# Patient Record
Sex: Female | Born: 1935 | Race: White | Hispanic: No | Smoking: Never smoker
Health system: Southern US, Community
[De-identification: ages and names within clinical notes are randomized; demographics above are authoritative.]

## PROBLEM LIST (undated history)

## (undated) DIAGNOSIS — M810 Age-related osteoporosis without current pathological fracture: Secondary | ICD-10-CM

## (undated) DIAGNOSIS — I38 Endocarditis, valve unspecified: Secondary | ICD-10-CM

## (undated) DIAGNOSIS — E669 Obesity, unspecified: Secondary | ICD-10-CM

## (undated) DIAGNOSIS — G40909 Epilepsy, unspecified, not intractable, without status epilepticus: Secondary | ICD-10-CM

## (undated) DIAGNOSIS — C801 Malignant (primary) neoplasm, unspecified: Secondary | ICD-10-CM

## (undated) DIAGNOSIS — R233 Spontaneous ecchymoses: Secondary | ICD-10-CM

## (undated) DIAGNOSIS — Z8739 Personal history of other diseases of the musculoskeletal system and connective tissue: Secondary | ICD-10-CM

## (undated) DIAGNOSIS — R238 Other skin changes: Secondary | ICD-10-CM

## (undated) DIAGNOSIS — M1990 Unspecified osteoarthritis, unspecified site: Secondary | ICD-10-CM

## (undated) DIAGNOSIS — E785 Hyperlipidemia, unspecified: Secondary | ICD-10-CM

## (undated) DIAGNOSIS — N189 Chronic kidney disease, unspecified: Secondary | ICD-10-CM

## (undated) DIAGNOSIS — I10 Essential (primary) hypertension: Secondary | ICD-10-CM

## (undated) DIAGNOSIS — J309 Allergic rhinitis, unspecified: Secondary | ICD-10-CM

## (undated) DIAGNOSIS — K219 Gastro-esophageal reflux disease without esophagitis: Secondary | ICD-10-CM

## (undated) DIAGNOSIS — K5790 Diverticulosis of intestine, part unspecified, without perforation or abscess without bleeding: Secondary | ICD-10-CM

## (undated) DIAGNOSIS — I8310 Varicose veins of unspecified lower extremity with inflammation: Secondary | ICD-10-CM

## (undated) DIAGNOSIS — R569 Unspecified convulsions: Secondary | ICD-10-CM

## (undated) HISTORY — PX: APPENDECTOMY: SHX54

## (undated) HISTORY — DX: Unspecified osteoarthritis, unspecified site: M19.90

## (undated) HISTORY — PX: ABDOMINAL HYSTERECTOMY: SHX81

## (undated) HISTORY — PX: JOINT REPLACEMENT: SHX530

## (undated) HISTORY — PX: COLONOSCOPY: SHX174

## (undated) HISTORY — DX: Malignant (primary) neoplasm, unspecified: C80.1

## (undated) HISTORY — PX: CHOLECYSTECTOMY: SHX55

## (undated) HISTORY — PX: TONSILLECTOMY: SUR1361

## (undated) HISTORY — PX: CATARACT EXTRACTION BILATERAL W/ ANTERIOR VITRECTOMY: SHX1304

## (undated) HISTORY — PX: CARPAL TUNNEL RELEASE: SHX101

## (undated) HISTORY — PX: SHOULDER SURGERY: SHX246

## (undated) HISTORY — PX: BREAST EXCISIONAL BIOPSY: SUR124

## (undated) HISTORY — DX: Unspecified convulsions: R56.9

## (undated) HISTORY — PX: BREAST SURGERY: SHX581

---

## 1999-03-09 HISTORY — PX: REPLACEMENT TOTAL KNEE: SUR1224

## 2003-03-09 HISTORY — PX: FOOT SURGERY: SHX648

## 2003-05-17 ENCOUNTER — Other Ambulatory Visit: Payer: Self-pay

## 2005-03-08 HISTORY — PX: TOTAL SHOULDER REPLACEMENT: SUR1217

## 2005-09-21 ENCOUNTER — Ambulatory Visit: Payer: Self-pay | Admitting: Specialist

## 2005-09-21 ENCOUNTER — Other Ambulatory Visit: Payer: Self-pay

## 2005-11-16 ENCOUNTER — Ambulatory Visit: Payer: Self-pay | Admitting: Orthopedic Surgery

## 2006-01-12 ENCOUNTER — Other Ambulatory Visit: Payer: Self-pay

## 2006-01-12 ENCOUNTER — Ambulatory Visit: Payer: Self-pay | Admitting: Orthopedic Surgery

## 2006-01-21 ENCOUNTER — Other Ambulatory Visit: Payer: Self-pay

## 2006-01-21 ENCOUNTER — Inpatient Hospital Stay: Payer: Self-pay | Admitting: Orthopedic Surgery

## 2006-01-22 ENCOUNTER — Other Ambulatory Visit: Payer: Self-pay

## 2006-04-15 ENCOUNTER — Ambulatory Visit: Payer: Self-pay | Admitting: Specialist

## 2006-05-27 ENCOUNTER — Encounter: Admission: RE | Admit: 2006-05-27 | Discharge: 2006-05-27 | Payer: Self-pay | Admitting: *Deleted

## 2006-10-06 ENCOUNTER — Ambulatory Visit: Payer: Self-pay | Admitting: Specialist

## 2006-10-11 ENCOUNTER — Ambulatory Visit: Payer: Self-pay | Admitting: Unknown Physician Specialty

## 2007-01-24 ENCOUNTER — Ambulatory Visit: Payer: Self-pay | Admitting: Specialist

## 2007-01-24 ENCOUNTER — Other Ambulatory Visit: Payer: Self-pay

## 2007-03-07 ENCOUNTER — Ambulatory Visit: Payer: Self-pay | Admitting: Cardiology

## 2007-06-06 ENCOUNTER — Ambulatory Visit: Payer: Self-pay | Admitting: Unknown Physician Specialty

## 2007-08-04 ENCOUNTER — Ambulatory Visit: Payer: Self-pay | Admitting: Vascular Surgery

## 2007-10-12 ENCOUNTER — Ambulatory Visit: Payer: Self-pay | Admitting: Unknown Physician Specialty

## 2007-11-02 ENCOUNTER — Ambulatory Visit: Payer: Self-pay | Admitting: Surgery

## 2008-05-21 ENCOUNTER — Ambulatory Visit: Payer: Self-pay | Admitting: Specialist

## 2008-10-25 ENCOUNTER — Ambulatory Visit: Payer: Self-pay | Admitting: Specialist

## 2008-10-30 ENCOUNTER — Ambulatory Visit: Payer: Self-pay | Admitting: Unknown Physician Specialty

## 2009-02-11 ENCOUNTER — Ambulatory Visit: Payer: Self-pay | Admitting: Pain Medicine

## 2009-03-05 ENCOUNTER — Ambulatory Visit: Payer: Self-pay | Admitting: Physician Assistant

## 2009-03-08 HISTORY — PX: BACK SURGERY: SHX140

## 2009-04-08 ENCOUNTER — Ambulatory Visit: Payer: Self-pay | Admitting: Pain Medicine

## 2009-06-04 ENCOUNTER — Inpatient Hospital Stay (HOSPITAL_COMMUNITY): Admission: RE | Admit: 2009-06-04 | Discharge: 2009-06-09 | Payer: Self-pay | Admitting: Neurosurgery

## 2009-12-02 ENCOUNTER — Ambulatory Visit: Payer: Self-pay | Admitting: Unknown Physician Specialty

## 2010-06-01 LAB — DIFFERENTIAL
Basophils Relative: 0 % (ref 0–1)
Eosinophils Absolute: 0.1 10*3/uL (ref 0.0–0.7)
Eosinophils Relative: 1 % (ref 0–5)
Lymphocytes Relative: 5 % — ABNORMAL LOW (ref 12–46)
Monocytes Absolute: 1 10*3/uL (ref 0.1–1.0)
Monocytes Relative: 10 % (ref 3–12)

## 2010-06-01 LAB — ABO/RH: ABO/RH(D): A POS

## 2010-06-01 LAB — CBC
Hemoglobin: 11.1 g/dL — ABNORMAL LOW (ref 12.0–15.0)
MCHC: 34.6 g/dL (ref 30.0–36.0)
MCHC: 34.7 g/dL (ref 30.0–36.0)
MCHC: 34.8 g/dL (ref 30.0–36.0)
MCV: 90.5 fL (ref 78.0–100.0)
MCV: 91 fL (ref 78.0–100.0)
Platelets: 136 10*3/uL — ABNORMAL LOW (ref 150–400)
Platelets: 195 10*3/uL (ref 150–400)
RBC: 3.52 MIL/uL — ABNORMAL LOW (ref 3.87–5.11)
WBC: 5.4 10*3/uL (ref 4.0–10.5)

## 2010-06-01 LAB — BASIC METABOLIC PANEL
BUN: 12 mg/dL (ref 6–23)
BUN: 21 mg/dL (ref 6–23)
CO2: 31 mEq/L (ref 19–32)
Chloride: 100 mEq/L (ref 96–112)
GFR calc Af Amer: >60 mL/min (ref >60)
GFR calc non Af Amer: >60 mL/min (ref >60)
Glucose, Bld: 106 mg/dL — ABNORMAL HIGH (ref 70–99)
Potassium: 3.3 mEq/L — ABNORMAL LOW (ref 3.5–5.1)
Sodium: 137 mEq/L (ref 135–145)
Sodium: 139 mEq/L (ref 135–145)

## 2010-06-01 LAB — SURGICAL PCR SCREEN: MRSA, PCR: NEGATIVE

## 2010-06-01 LAB — TYPE AND SCREEN: ABO/RH(D): A POS

## 2010-06-01 LAB — URINE CULTURE

## 2010-06-01 LAB — CULTURE, BLOOD (ROUTINE X 2): Culture: NO GROWTH

## 2010-06-03 ENCOUNTER — Ambulatory Visit: Payer: Self-pay | Admitting: Specialist

## 2010-08-07 ENCOUNTER — Ambulatory Visit: Payer: Self-pay | Admitting: Specialist

## 2010-12-29 ENCOUNTER — Ambulatory Visit: Payer: Self-pay | Admitting: Specialist

## 2011-06-11 DIAGNOSIS — G40909 Epilepsy, unspecified, not intractable, without status epilepticus: Secondary | ICD-10-CM | POA: Insufficient documentation

## 2011-06-11 DIAGNOSIS — I10 Essential (primary) hypertension: Secondary | ICD-10-CM | POA: Insufficient documentation

## 2011-06-11 DIAGNOSIS — M810 Age-related osteoporosis without current pathological fracture: Secondary | ICD-10-CM | POA: Insufficient documentation

## 2011-06-11 DIAGNOSIS — E669 Obesity, unspecified: Secondary | ICD-10-CM | POA: Insufficient documentation

## 2011-06-11 DIAGNOSIS — E7800 Pure hypercholesterolemia, unspecified: Secondary | ICD-10-CM | POA: Insufficient documentation

## 2011-06-11 DIAGNOSIS — I8390 Asymptomatic varicose veins of unspecified lower extremity: Secondary | ICD-10-CM | POA: Insufficient documentation

## 2011-12-13 ENCOUNTER — Ambulatory Visit: Payer: Self-pay | Admitting: Specialist

## 2011-12-29 ENCOUNTER — Ambulatory Visit: Payer: Self-pay | Admitting: Specialist

## 2012-01-18 ENCOUNTER — Ambulatory Visit: Payer: Self-pay | Admitting: Specialist

## 2012-03-17 ENCOUNTER — Ambulatory Visit: Payer: Self-pay | Admitting: Unknown Physician Specialty

## 2012-03-21 LAB — PATHOLOGY REPORT

## 2012-07-18 ENCOUNTER — Ambulatory Visit: Payer: Self-pay | Admitting: Specialist

## 2013-01-09 ENCOUNTER — Other Ambulatory Visit: Payer: Self-pay | Admitting: Neurology

## 2013-01-09 MED ORDER — GABAPENTIN 300 MG PO CAPS: 300.0000 mg | ORAL_CAPSULE | Freq: Two times a day (BID) | ORAL | Status: AC

## 2013-01-09 MED ORDER — CARBAMAZEPINE 200 MG PO TABS: 200.0000 mg | ORAL_TABLET | Freq: Two times a day (BID) | ORAL | Status: AC

## 2013-01-09 NOTE — Telephone Encounter (Signed)
Pt's prescriptions were faxed over to [facility] at [phone].

## 2013-01-18 ENCOUNTER — Ambulatory Visit: Payer: Self-pay | Admitting: Family Medicine

## 2014-01-22 ENCOUNTER — Ambulatory Visit: Payer: Self-pay | Admitting: Family Medicine

## 2014-05-10 ENCOUNTER — Ambulatory Visit: Payer: Self-pay | Admitting: Neurosurgery

## 2014-07-30 ENCOUNTER — Other Ambulatory Visit: Payer: Self-pay | Admitting: Specialist

## 2014-07-30 DIAGNOSIS — M25552 Pain in left hip: Secondary | ICD-10-CM

## 2014-08-12 ENCOUNTER — Ambulatory Visit
Admission: RE | Admit: 2014-08-12 | Discharge: 2014-08-12 | Disposition: A | Discharge status: Home / Self Care | Payer: Medicare Other | Source: Ambulatory Visit | Attending: Specialist | Admitting: Specialist

## 2014-08-12 DIAGNOSIS — M25552 Pain in left hip: Secondary | ICD-10-CM | POA: Insufficient documentation

## 2014-08-12 HISTORY — DX: Chronic kidney disease, unspecified: N18.9

## 2014-08-12 MED ORDER — METHYLPREDNISOLONE ACETATE 80 MG/ML IJ SUSP
80.0000 mg | Freq: Once | INTRAMUSCULAR | Status: AC
  Administered 2014-08-12: 80 mg via INTRA_ARTICULAR

## 2014-08-12 MED ORDER — METHYLPREDNISOLONE ACETATE 80 MG/ML IJ SUSP: 1.0000 mg | Freq: Once | INTRAMUSCULAR | Status: DC

## 2014-08-12 MED ORDER — BUPIVACAINE HCL (PF) 0.25 % IJ SOLN
7.0000 mL | Freq: Once | INTRAMUSCULAR | Status: AC
  Administered 2014-08-12: 7 mL via INTRA_ARTICULAR

## 2015-01-01 ENCOUNTER — Other Ambulatory Visit: Payer: Self-pay | Admitting: Pain Medicine

## 2015-01-01 ENCOUNTER — Ambulatory Visit: Payer: Medicare Other | Attending: Pain Medicine | Admitting: Pain Medicine

## 2015-01-01 ENCOUNTER — Encounter: Payer: Self-pay | Admitting: Pain Medicine

## 2015-01-01 VITALS — BP 181/52 | HR 70 | Temp 98.4°F | Resp 18 | Ht 63.5 in | Wt 205.0 lb

## 2015-01-01 DIAGNOSIS — Z79899 Other long term (current) drug therapy: Secondary | ICD-10-CM | POA: Diagnosis not present

## 2015-01-01 DIAGNOSIS — M79605 Pain in left leg: Secondary | ICD-10-CM | POA: Diagnosis not present

## 2015-01-01 DIAGNOSIS — F1190 Opioid use, unspecified, uncomplicated: Secondary | ICD-10-CM | POA: Diagnosis not present

## 2015-01-01 DIAGNOSIS — I10 Essential (primary) hypertension: Secondary | ICD-10-CM | POA: Diagnosis not present

## 2015-01-01 DIAGNOSIS — M25552 Pain in left hip: Secondary | ICD-10-CM | POA: Diagnosis not present

## 2015-01-01 DIAGNOSIS — G8929 Other chronic pain: Secondary | ICD-10-CM | POA: Diagnosis not present

## 2015-01-01 DIAGNOSIS — M5416 Radiculopathy, lumbar region: Secondary | ICD-10-CM

## 2015-01-01 DIAGNOSIS — M545 Low back pain: Secondary | ICD-10-CM | POA: Insufficient documentation

## 2015-01-01 DIAGNOSIS — M539 Dorsopathy, unspecified: Secondary | ICD-10-CM

## 2015-01-01 DIAGNOSIS — Z9889 Other specified postprocedural states: Secondary | ICD-10-CM | POA: Insufficient documentation

## 2015-01-01 DIAGNOSIS — Z981 Arthrodesis status: Secondary | ICD-10-CM | POA: Insufficient documentation

## 2015-01-01 DIAGNOSIS — M961 Postlaminectomy syndrome, not elsewhere classified: Secondary | ICD-10-CM

## 2015-01-01 DIAGNOSIS — F1120 Opioid dependence, uncomplicated: Secondary | ICD-10-CM

## 2015-01-01 DIAGNOSIS — M25559 Pain in unspecified hip: Secondary | ICD-10-CM | POA: Diagnosis present

## 2015-01-01 DIAGNOSIS — Z5181 Encounter for therapeutic drug level monitoring: Secondary | ICD-10-CM

## 2015-01-01 DIAGNOSIS — Z79891 Long term (current) use of opiate analgesic: Secondary | ICD-10-CM

## 2015-01-01 MED ORDER — FENTANYL 12 MCG/HR TD PT72: 12.5000 ug | MEDICATED_PATCH | TRANSDERMAL | Status: DC

## 2015-01-01 NOTE — Patient Instructions (Addendum)
Nothing to eat or drink 6 hours before your appointment. You will not be able to drive for 24 hours after the procedure so you will need to bring a driver with you to your appointment.   A prescription for DURAGESIC was given to you today.

## 2015-01-01 NOTE — Progress Notes (Signed)
Safety precautions to be maintained throughout the outpatient stay will include: orient to surroundings, keep bed in low position, maintain call bell within reach at all times, provide assistance with transfer out of bed and ambulation.  

## 2015-01-03 ENCOUNTER — Other Ambulatory Visit
Admission: RE | Admit: 2015-01-03 | Discharge: 2015-01-03 | Disposition: A | Discharge status: Home / Self Care | Payer: Medicare Other | Source: Ambulatory Visit | Attending: Pain Medicine | Admitting: Pain Medicine

## 2015-01-03 DIAGNOSIS — G8929 Other chronic pain: Secondary | ICD-10-CM | POA: Insufficient documentation

## 2015-01-03 LAB — COMPREHENSIVE METABOLIC PANEL
ALBUMIN: 3.8 g/dL (ref 3.5–5.0)
ALT: 11 U/L — ABNORMAL LOW (ref 14–54)
AST: 19 U/L (ref 15–41)
Alkaline Phosphatase: 57 U/L (ref 38–126)
Anion gap: 9 (ref 5–15)
BILIRUBIN TOTAL: 0.6 mg/dL (ref 0.3–1.2)
BUN: 19 mg/dL (ref 6–20)
CO2: 26 mmol/L (ref 22–32)
Calcium: 9.1 mg/dL (ref 8.9–10.3)
Chloride: 108 mmol/L (ref 101–111)
Creatinine, Ser: 0.8 mg/dL (ref 0.44–1.00)
GFR calc Af Amer: >60 mL/min (ref >60)
GFR calc non Af Amer: >60 mL/min (ref >60)
GLUCOSE: 96 mg/dL (ref 65–99)
POTASSIUM: 3.8 mmol/L (ref 3.5–5.1)
Sodium: 143 mmol/L (ref 135–145)
Total Protein: 6.5 g/dL (ref 6.5–8.1)

## 2015-01-03 LAB — SEDIMENTATION RATE: Sed Rate: 15 mm/hr (ref 0–30)

## 2015-01-03 LAB — C-REACTIVE PROTEIN: CRP: <0.5 mg/dL (ref <1.0)

## 2015-01-03 LAB — MAGNESIUM: Magnesium: 2 mg/dL (ref 1.7–2.4)

## 2015-01-08 LAB — 25-HYDROXY VITAMIN D LCMS D2+D3
25-Hydroxy, Vitamin D-2: <1 ng/mL
25-Hydroxy, Vitamin D-3: 21 ng/mL
25-Hydroxy, Vitamin D: 21 ng/mL — ABNORMAL LOW

## 2015-01-09 ENCOUNTER — Ambulatory Visit: Payer: Medicare Other | Attending: Pain Medicine | Admitting: Pain Medicine

## 2015-01-09 ENCOUNTER — Encounter: Payer: Self-pay | Admitting: Pain Medicine

## 2015-01-09 VITALS — BP 161/78 | HR 68 | Temp 97.8°F | Resp 16 | Ht 63.0 in | Wt 206.0 lb

## 2015-01-09 DIAGNOSIS — M791 Myalgia: Secondary | ICD-10-CM | POA: Diagnosis not present

## 2015-01-09 DIAGNOSIS — E7800 Pure hypercholesterolemia, unspecified: Secondary | ICD-10-CM | POA: Diagnosis not present

## 2015-01-09 DIAGNOSIS — M1612 Unilateral primary osteoarthritis, left hip: Secondary | ICD-10-CM | POA: Insufficient documentation

## 2015-01-09 DIAGNOSIS — M8580 Other specified disorders of bone density and structure, unspecified site: Secondary | ICD-10-CM | POA: Insufficient documentation

## 2015-01-09 DIAGNOSIS — G8929 Other chronic pain: Secondary | ICD-10-CM

## 2015-01-09 DIAGNOSIS — M25552 Pain in left hip: Secondary | ICD-10-CM | POA: Diagnosis present

## 2015-01-09 DIAGNOSIS — G40909 Epilepsy, unspecified, not intractable, without status epilepticus: Secondary | ICD-10-CM | POA: Diagnosis not present

## 2015-01-09 DIAGNOSIS — I10 Essential (primary) hypertension: Secondary | ICD-10-CM | POA: Insufficient documentation

## 2015-01-09 DIAGNOSIS — M7918 Myalgia, other site: Secondary | ICD-10-CM | POA: Insufficient documentation

## 2015-01-09 MED ORDER — TRIAMCINOLONE ACETONIDE 40 MG/ML IJ SUSP
INTRAMUSCULAR | Status: AC
  Administered 2015-01-09: 10:00:00
  Filled 2015-01-09: qty 1

## 2015-01-09 MED ORDER — FENTANYL CITRATE (PF) 100 MCG/2ML IJ SOLN
INTRAMUSCULAR | Status: AC
  Administered 2015-01-09: 50 ug
  Filled 2015-01-09: qty 2

## 2015-01-09 MED ORDER — FENTANYL CITRATE (PF) 100 MCG/2ML IJ SOLN: 100.0000 ug | Freq: Once | INTRAMUSCULAR | Status: DC

## 2015-01-09 MED ORDER — IOHEXOL 180 MG/ML  SOLN
INTRAMUSCULAR | Status: AC
Start: 2015-01-09 — End: 2015-01-09
  Administered 2015-01-09: 10:00:00
  Filled 2015-01-09: qty 20

## 2015-01-09 MED ORDER — ROPIVACAINE HCL 2 MG/ML IJ SOLN: 9.0000 mL | Freq: Once | INTRAMUSCULAR | Status: DC

## 2015-01-09 MED ORDER — ROPIVACAINE HCL 2 MG/ML IJ SOLN
INTRAMUSCULAR | Status: AC
  Administered 2015-01-09: 10:00:00
  Filled 2015-01-09: qty 10

## 2015-01-09 MED ORDER — LIDOCAINE HCL (PF) 1 % IJ SOLN: 10.0000 mL | Freq: Once | INTRAMUSCULAR | Status: DC

## 2015-01-09 MED ORDER — MIDAZOLAM HCL 5 MG/5ML IJ SOLN
INTRAMUSCULAR | Status: AC
  Administered 2015-01-09: 2 mg via INTRAVENOUS
  Filled 2015-01-09: qty 5

## 2015-01-09 MED ORDER — LACTATED RINGERS IV SOLN: 1000.0000 mL | INTRAVENOUS | Status: AC

## 2015-01-09 MED ORDER — METHYLPREDNISOLONE ACETATE 80 MG/ML IJ SUSP
INTRAMUSCULAR | Status: AC
  Administered 2015-01-09: 10:00:00
  Filled 2015-01-09: qty 1

## 2015-01-09 MED ORDER — METHYLPREDNISOLONE ACETATE 80 MG/ML IJ SUSP: 80.0000 mg | Freq: Once | INTRAMUSCULAR | Status: DC

## 2015-01-09 MED ORDER — ROPIVACAINE HCL 2 MG/ML IJ SOLN
INTRAMUSCULAR | Status: AC
Start: 2015-01-09 — End: 2015-01-09
  Administered 2015-01-09: 10:00:00
  Filled 2015-01-09: qty 10

## 2015-01-09 MED ORDER — METHYLPREDNISOLONE ACETATE 40 MG/ML IJ SUSP: 40.0000 mg | Freq: Once | INTRAMUSCULAR | Status: DC

## 2015-01-09 MED ORDER — MIDAZOLAM HCL 5 MG/5ML IJ SOLN: 5.0000 mg | Freq: Once | INTRAMUSCULAR | Status: DC

## 2015-01-09 NOTE — Progress Notes (Signed)
Safety precautions to be maintained throughout the outpatient stay will include: orient to surroundings, keep bed in low position, maintain call bell within reach at all times, provide assistance with transfer out of bed and ambulation.  

## 2015-01-09 NOTE — Patient Instructions (Addendum)
GENERAL RISKS AND COMPLICATIONS  What are the risk, side effects and possible complications? Generally speaking, most procedures are safe.  However, with any procedure there are risks, side effects, and the possibility of complications.  The risks and complications are dependent upon the sites that are lesioned, or the type of nerve block to be performed.  The closer the procedure is to the spine, the more serious the risks are.  Great care is taken when placing the radio frequency needles, block needles or lesioning probes, but sometimes complications can occur.  Infection: Any time there is an injection through the skin, there is a risk of infection.  This is why sterile conditions are used for these blocks.  There are four possible types of infection.  Localized skin infection.  Central Nervous System Infection-This can be in the form of Meningitis, which can be deadly.  Epidural Infections-This can be in the form of an epidural abscess, which can cause pressure inside of the spine, causing compression of the spinal cord with subsequent paralysis. This would require an emergency surgery to decompress, and there are no guarantees that the patient would recover from the paralysis.  Discitis-This is an infection of the intervertebral discs.  It occurs in about 1% of discography procedures.  It is difficult to treat and it may lead to surgery.        2. Pain: the needles have to go through skin and soft tissues, will cause soreness.       3. Damage to internal structures:  The nerves to be lesioned may be near blood vessels or    other nerves which can be potentially damaged.       4. Bleeding: Bleeding is more common if the patient is taking blood thinners such as  aspirin, Coumadin, Ticiid, Plavix, etc., or if he/she have some genetic predisposition  such as hemophilia. Bleeding into the spinal canal can cause compression of the spinal  cord with subsequent paralysis.  This would require an  emergency surgery to  decompress and there are no guarantees that the patient would recover from the  paralysis.       5. Pneumothorax:  Puncturing of a lung is a possibility, every time a needle is introduced in  the area of the chest or upper back.  Pneumothorax refers to free air around the  collapsed lung(s), inside of the thoracic cavity (chest cavity).  Another two possible  complications related to a similar event would include: Hemothorax and Chylothorax.   These are variations of the Pneumothorax, where instead of air around the collapsed  lung(s), you may have blood or chyle, respectively.       6. Spinal headaches: They may occur with any procedures in the area of the spine.       7. Persistent CSF (Cerebro-Spinal Fluid) leakage: This is a rare problem, but may occur  with prolonged intrathecal or epidural catheters either due to the formation of a fistulous  track or a dural tear.       8. Nerve damage: By working so close to the spinal cord, there is always a possibility of  nerve damage, which could be as serious as a permanent spinal cord injury with  paralysis.       9. Death:  Although rare, severe deadly allergic reactions known as "Anaphylactic  reaction" can occur to any of the medications used.      10. Worsening of the symptoms:  We can always make thing worse.  What are the chances of something like this happening? Chances of any of this occuring are extremely low.  By statistics, you have more of a chance of getting killed in a motor vehicle accident: while driving to the hospital than any of the above occurring .  Nevertheless, you should be aware that they are possibilities.  In general, it is similar to taking a shower.  Everybody knows that you can slip, hit your head and get killed.  Does that mean that you should not shower again?  Nevertheless always keep in mind that statistics do not mean anything if you happen to be on the wrong side of them.  Even if a procedure has a 1  (one) in a 1,000,000 (million) chance of going wrong, it you happen to be that one..Also, keep in mind that by statistics, you have more of a chance of having something go wrong when taking medications.  Who should not have this procedure? If you are on a blood thinning medication (e.g. Coumadin, Plavix, see list of "Blood Thinners"), or if you have an active infection going on, you should not have the procedure.  If you are taking any blood thinners, please inform your physician.  How should I prepare for this procedure?  Do not eat or drink anything at least six hours prior to the procedure.  Bring a driver with you .  It cannot be a taxi.  Come accompanied by an adult that can drive you back, and that is strong enough to help you if your legs get weak or numb from the local anesthetic.  Take all of your medicines the morning of the procedure with just enough water to swallow them.  If you have diabetes, make sure that you are scheduled to have your procedure done first thing in the morning, whenever possible.  If you have diabetes, take only half of your insulin dose and notify our nurse that you have done so as soon as you arrive at the clinic.  If you are diabetic, but only take blood sugar pills (oral hypoglycemic), then do not take them on the morning of your procedure.  You may take them after you have had the procedure.  Do not take aspirin or any aspirin-containing medications, at least eleven (11) days prior to the procedure.  They may prolong bleeding.  Wear loose fitting clothing that may be easy to take off and that you would not mind if it got stained with Betadine or blood.  Do not wear any jewelry or perfume  Remove any nail coloring.  It will interfere with some of our monitoring equipment.  NOTE: Remember that this is not meant to be interpreted as a complete list of all possible complications.  Unforeseen problems may occur.  BLOOD THINNERS The following drugs  contain aspirin or other products, which can cause increased bleeding during surgery and should not be taken for 2 weeks prior to and 1 week after surgery.  If you should need take something for relief of minor pain, you may take acetaminophen which is found in Tylenol,m Datril, Anacin-3 and Panadol. It is not blood thinner. The products listed below are.  Do not take any of the products listed below in addition to any listed on your instruction sheet.  A.P.C or A.P.C with Codeine Codeine Phosphate Capsules #3 Ibuprofen Ridaura  ABC compound Congesprin Imuran rimadil  Advil Cope Indocin Robaxisal  Alka-Seltzer Effervescent Pain Reliever and Antacid Coricidin or Coricidin-D  Indomethacin Rufen    Alka-Seltzer plus Cold Medicine Cosprin Ketoprofen S-A-C Tablets  Anacin Analgesic Tablets or Capsules Coumadin Korlgesic Salflex  Anacin Extra Strength Analgesic tablets or capsules CP-2 Tablets Lanoril Salicylate  Anaprox Cuprimine Capsules Levenox Salocol  Anexsia-D Dalteparin Magan Salsalate  Anodynos Darvon compound Magnesium Salicylate Sine-off  Ansaid Dasin Capsules Magsal Sodium Salicylate  Anturane Depen Capsules Marnal Soma  APF Arthritis pain formula Dewitt's Pills Measurin Stanback  Argesic Dia-Gesic Meclofenamic Sulfinpyrazone  Arthritis Bayer Timed Release Aspirin Diclofenac Meclomen Sulindac  Arthritis pain formula Anacin Dicumarol Medipren Supac  Analgesic (Safety coated) Arthralgen Diffunasal Mefanamic Suprofen  Arthritis Strength Bufferin Dihydrocodeine Mepro Compound Suprol  Arthropan liquid Dopirydamole Methcarbomol with Aspirin Synalgos  ASA tablets/Enseals Disalcid Micrainin Tagament  Ascriptin Doan's Midol Talwin  Ascriptin A/D Dolene Mobidin Tanderil  Ascriptin Extra Strength Dolobid Moblgesic Ticlid  Ascriptin with Codeine Doloprin or Doloprin with Codeine Momentum Tolectin  Asperbuf Duoprin Mono-gesic Trendar  Aspergum Duradyne Motrin or Motrin IB Triminicin  Aspirin  plain, buffered or enteric coated Durasal Myochrisine Trigesic  Aspirin Suppositories Easprin Nalfon Trillsate  Aspirin with Codeine Ecotrin Regular or Extra Strength Naprosyn Uracel  Atromid-S Efficin Naproxen Ursinus  Auranofin Capsules Elmiron Neocylate Vanquish  Axotal Emagrin Norgesic Verin  Azathioprine Empirin or Empirin with Codeine Normiflo Vitamin E  Azolid Emprazil Nuprin Voltaren  Bayer Aspirin plain, buffered or children's or timed BC Tablets or powders Encaprin Orgaran Warfarin Sodium  Buff-a-Comp Enoxaparin Orudis Zorpin  Buff-a-Comp with Codeine Equegesic Os-Cal-Gesic   Buffaprin Excedrin plain, buffered or Extra Strength Oxalid   Bufferin Arthritis Strength Feldene Oxphenbutazone   Bufferin plain or Extra Strength Feldene Capsules Oxycodone with Aspirin   Bufferin with Codeine Fenoprofen Fenoprofen Pabalate or Pabalate-SF   Buffets II Flogesic Panagesic   Buffinol plain or Extra Strength Florinal or Florinal with Codeine Panwarfarin   Buf-Tabs Flurbiprofen Penicillamine   Butalbital Compound Four-way cold tablets Penicillin   Butazolidin Fragmin Pepto-Bismol   Carbenicillin Geminisyn Percodan   Carna Arthritis Reliever Geopen Persantine   Carprofen Gold's salt Persistin   Chloramphenicol Goody's Phenylbutazone   Chloromycetin Haltrain Piroxlcam   Clmetidine heparin Plaquenil   Cllnoril Hyco-pap Ponstel   Clofibrate Hydroxy chloroquine Propoxyphen         Before stopping any of these medications, be sure to consult the physician who ordered them.  Some, such as Coumadin (Warfarin) are ordered to prevent or treat serious conditions such as "deep thrombosis", "pumonary embolisms", and other heart problems.  The amount of time that you may need off of the medication may also vary with the medication and the reason for which you were taking it.  If you are taking any of these medications, please make sure you notify your pain physician before you undergo any  procedures.         Pain Management Discharge Instructions  General Discharge Instructions :  If you need to reach your doctor call: Monday-Friday 8:00 am - 4:00 pm at [phone] or toll free [phone].  After clinic hours [phone] to have operator reach doctor.  Bring all of your medication bottles to all your appointments in the pain clinic.  To cancel or reschedule your appointment with Pain Management please remember to call 24 hours in advance to avoid a fee.  Refer to the educational materials which you have been given on: General Risks, I had my Procedure. Discharge Instructions, Post Sedation.  Post Procedure Instructions:  The drugs you were given will stay in your system until tomorrow, so for the next 24 hours you should   not drive, make any legal decisions or drink any alcoholic beverages.  You may eat anything you prefer, but it is better to start with liquids then soups and crackers, and gradually work up to solid foods.  Please notify your doctor immediately if you have any unusual bleeding, trouble breathing or pain that is not related to your normal pain.  Depending on the type of procedure that was done, some parts of your body may feel week and/or numb.  This usually clears up by tonight or the next day.  Walk with the use of an assistive device or accompanied by an adult for the 24 hours.  You may use ice on the affected area for the first 24 hours.  Put ice in a Ziploc bag and cover with a towel and place against area 15 minutes on 15 minutes off.  You may switch to heat after 24 hours.Trigger Point Injection Trigger points are areas where you have muscle pain. A trigger point injection is a shot given in the trigger point to relieve that pain. A trigger point might feel like a knot in your muscle. It hurts to press on a trigger point. Sometimes the pain spreads out (radiates) to other parts of the body. For example, pressing on a trigger point in your  shoulder might cause pain in your arm or neck. You might have one trigger point. Or, you might have more than one. People often have trigger points in their upper back and lower back. They also occur often in the neck and shoulders. Pain from a trigger point lasts for a long time. It can make it hard to keep moving. You might not be able to do the exercise or physical therapy that could help you deal with the pain. A trigger point injection may help. It does not work for everyone. But, it may relieve your pain for a few days or a few months. A trigger point injection does not cure long-lasting (chronic) pain. LET YOUR CAREGIVER KNOW ABOUT:  Any allergies (especially to latex, lidocaine, or steroids).  Blood-thinning medicines that you take. These drugs can lead to bleeding or bruising after an injection. They include:  Aspirin.  Ibuprofen.  Clopidogrel.  Warfarin.  Other medicines you take. This includes all vitamins, herbs, eyedrops, over-the-counter medicines, and creams.  Use of steroids.  Recent infections.  Past problems with numbing medicines.  Bleeding problems.  Surgeries you have had.  Other health problems. RISKS AND COMPLICATIONS A trigger point injection is a safe treatment. However, problems may develop, such as:  Minor side effects usually go away in 1 to 2 days. These may include:  Soreness.  Bruising.  Stiffness.  More serious problems are rare. But, they may include:  Bleeding under the skin (hematoma).  Skin infection.  Breaking off of the needle under your skin.  Lung puncture.  The trigger point injection may not work for you. BEFORE THE PROCEDURE You may need to stop taking any medicine that thins your blood. This is to prevent bleeding and bruising. Usually these medicines are stopped several days before the injection. No other preparation is needed. PROCEDURE  A trigger point injection can be given in your caregiver's office or in a clinic.  Each injection takes 2 minutes or less.  Your caregiver will feel for trigger points. The caregiver may use a marker to circle the area for the injection.  The skin over the trigger point will be washed with a germ-killing (antiseptic) solution.  The  caregiver pinches the spot for the injection.  Then, a very thin needle is used for the shot. You may feel pain or a twitching feeling when the needle enters the trigger point.  A numbing solution may be injected into the trigger point. Sometimes a drug to keep down swelling, redness, and warmth (inflammation) is also injected.  Your caregiver moves the needle around the trigger zone until the tightness and twitching goes away.  After the injection, your caregiver may put gentle pressure over the injection site.  Then it is covered with a bandage. AFTER THE PROCEDURE  You can go right home after the injection.  The bandage can be taken off after a few hours.  You may feel sore and stiff for 1 to 2 days.  Go back to your regular activities slowly. Your caregiver may ask you to stretch your muscles. Do not do anything that takes extra energy for a few days.  Follow your caregiver's instructions to manage and treat other pain.   This information is not intended to replace advice given to you by your health care provider. Make sure you discuss any questions you have with your health care provider.   Document Released: 02/11/2011 Document Revised: 06/19/2012 Document Reviewed: 02/11/2011 Elsevier Interactive Patient Education [employer].

## 2015-01-10 ENCOUNTER — Telehealth: Payer: Self-pay

## 2015-01-10 LAB — TOXASSURE SELECT 13 (MW), URINE: PDF: 0

## 2015-01-10 NOTE — Telephone Encounter (Signed)
Left message

## 2015-01-10 NOTE — Progress Notes (Signed)
Patient's Name: Alisha Mullen MRN: [id number] DOB: 1934/12/09 DOS: 01/09/2015  Primary Reason(s) for Visit: Interventional Pain Management Treatment. CC: Hip Pain  Pre-Procedure Assessment: Ms. Canal is a 80 y.o. year old, female patient, seen today for interventional treatment. She has Chronic pain; Chronic left hip pain; Other long term (current) drug therapy; Epilepsy ([place]); Hypercholesterolemia; BP (high blood pressure); Adiposity; Arthritis, degenerative; Osteoporosis, post-menopausal; Leg varices; History of lumbosacral spine surgery; Myofascial pain syndrome (left buttocks area); and Musculoskeletal pain on her problem list.. Her primarily concern today is the Hip Pain Verification of the correct person, correct site (including marking of site), and correct procedure were performed and confirmed by the patient. Today's Vitals   01/09/15 1014 01/09/15 1024 01/09/15 1034 01/09/15 1044  BP: 118/100 145/63 160/72 161/78  Pulse: 70 72 65 68  Temp:      TempSrc:      Resp: _0 Height:      Weight:      SpO2: 100% 99% 100% 99%  PainSc: 0-No pain   0-No pain  Calculated BMI: Body mass index is 36.5 kg/(m^2). Allergies: She is allergic to demerol; metoprolol; codeine; lotrel; norvasc; penicillins; sulfa antibiotics; amlodipine; vioxx; hyzaar; lipitor; and loratadine.. Primary Diagnosis: Primary osteoarthritis of left hip [M16.12]  Procedure #1: Type: Diagnostic Intra-Articular Hip Injection Region:  Posterolateral hip joint area. Level: Lower pelvic and hip joint level. Laterality: Left-Sided  Indications: Hip Joint Pain Hip Joint Arthralgia   Procedure #2: Type: Therapeutic Trigger Point Injection Region: Posterolateral Buttocks area. Gluteus maximus muscle. Level: Mid to lower Buttocks   Laterality: Left-Sided    Indications: Myofascial Pain, Musculoskeletal Pain, Hip Pain and Lower Extremity Musculoskeletal Pain  Consent: Secured. Under the influence of no  sedatives a written informed consent was obtained, after having provided information on the risks and possible complications. To fulfill our ethical and legal obligations, as recommended by the American Medical Association's Code of Ethics, we have provided information to the patient about our clinical impression; the nature and purpose of the treatment or procedure; the risks, benefits, and possible complications of the intervention; alternatives; the risk(s) and benefit(s) of the alternative treatment(s) or procedure(s); and the risk(s) and benefit(s) of doing nothing. The patient was provided information about the risks and possible complications associated with the procedure. These include, but are not limited to, failure to achieve desired goals, infection, bleeding, organ or nerve damage, allergic reactions, paralysis, and death. In the case of intra- or periarticular procedures these may include, but are not limited to, failure to achieve desired goals, infection, bleeding (hemarthrosis), organ or nerve damage, allergic reactions, and death. In addition, the patient was informed that Medicine is not an exact science; therefore, there is also the possibility of unforeseen risks and possible complications that may result in a catastrophic outcome. The patient indicated having understood very clearly. We have given the patient no guarantees and we have made no promises. Enough time was given to the patient to ask questions, all of which were answered to the patient's satisfaction.  Pre-Procedure #1 Preparation: Safety Precautions: Allergies reviewed. Appropriate site, procedure, and patient were confirmed by following the Joint Commission's Universal Protocol (UP.01.01.01), in the form of a "Time Out". The patient was asked to confirm marked site and procedure, before commencing. The patient was asked about blood thinners, or active infections, both of which were denied. Patient was assessed for positional  comfort and all pressure points were checked before starting procedure. Monitoring:  As per  clinic protocol. Infection Control Precautions: Sterile technique used. Standard Universal Precautions were taken as recommended by the Department of [facility] for Disease Control and Prevention (CDC). Standard pre-surgical skin prep was conducted. Respiratory hygiene and cough etiquette was practiced. Hand hygiene observed. Safe injection practices and needle disposal techniques followed. SDV (single dose vial) medications used. Medications properly checked for expiration dates and contaminants. Personal protective equipment (PPE) used: Radiation resistant gloves.  Anesthesia, Analgesia, Anxiolysis: Type: Moderate (Conscious) Sedation & Local Anesthesia. Meaningful verbal contact was maintained, with the patient at all times during the procedure. Local Anesthetic(s): Lidocaine 1% Route: Intravenous (IV) IV Access: Secured Sedation: Meaningful verbal contact was maintained at all times during the procedure. Indication(s): Anxiolysis and Analgesia.  Description of Procedure Process:  Time-out: "Time-out" completed before starting procedure, as per protocol. Position: Right Lateral Decubitus. Target Area: Superior aspect of the hip joint cavity, going thru the superior portion of the capsular ligament. Approach: Lateral approach. Area Prepped: Entire Posterolateral hip area. Prepping solution: ChloraPrep (2% chlorhexidine gluconate and 70% isopropyl alcohol) Safety Precautions: Aspiration looking for blood return was conducted prior to all injections. At no point did we inject any substances, as a needle was being advanced. No attempts were made at seeking any paresthesias. Safe injection practices and needle disposal techniques used. Medications properly checked for expiration dates. SDV (single dose vial) medications used. Description of the Procedure: Protocol guidelines were followed. The  patient was placed in position over the fluoroscopy table. The target area was identified and the area prepped in the usual manner. Skin & deeper tissues infiltrated with local anesthetic. Appropriate amount of time allowed to pass for local anesthetics to take effect. The procedure needles were then advanced to the target area. Proper needle placement secured. Negative aspiration confirmed. Solution injected in intermittent fashion, asking for systemic symptoms every 0.5cc of injectate. The needles were then removed and the area cleansed, making sure to leave some of the prepping solution back to take advantage of its long term bactericidal properties. EBL: Minimal Materials & Medications Used:  Needle(s) Used: 22g - 5" Spinal Needle(s) Solution Injected: 0.2% PF-Ropivacaine (44m) + SDV-DepoMedrol 80 mg/ml (11m Medications Administered today: We administered fentaNYL, midazolam, ropivacaine (PF) 2 mg/ml (0.2%), triamcinolone acetonide, ropivacaine (PF) 2 mg/ml (0.2%), methylPREDNISolone acetate, and iohexol.Please see chart orders for dosing details.  Imaging Guidance:  Type of Imaging Technique: Fluoroscopy Guidance (Non-spinal) Indication(s): Assistance in needle guidance and placement for procedures requiring needle placement in or near specific anatomical locations not easily accessible without such assistance. Exposure Time: Please see nurses notes. Contrast: Before injecting any contrast, we confirmed that the patient did not have an allergy to iodine, shellfish, or radiological contrast. Once satisfactory needle placement was completed at the desired level, radiological contrast was injected. Injection was conducted under continuous fluoroscopic guidance. Injection of contrast accomplished without complications. See chart for type and volume of contrast used. Fluoroscopic Guidance: I was personally present in the fluoroscopy suite, where the patient was placed in position for the procedure, over  the fluoroscopy-compatible table. Fluoroscopy was manipulated, using "Tunnel Vision Technique", to obtain the best possible view of the target area, on the affected side. Parallax error was corrected before commencing the procedure. A "direction-depth-direction" technique was used to introduce the needle under continuous pulsed fluoroscopic guidance. Once the target was reached, antero-posterior, oblique, and lateral fluoroscopic projection views were taken to confirm needle placement in all planes. Permanently recorded images stored by scanning into EMR. Ultrasound Guidance: Not used. Interpretation:  Intraoperative imaging interpretation by performing Physician. Adequate needle placement confirmed. Needle placement confirmed in AP, lateral, & Oblique Views. Appropriate spread of contrast to desired area. No evidence of afferent or efferent intravascular uptake. Permanent hardcopy images in multiple planes scanned into the patient's record.   Description of Procedure #2 Process:  Time-out: "Time-out" completed before starting procedure, as per protocol. Position: As per procedure #1. Target Area: Myofascial Trigger Point  Approach: Direct percutaneous approach. Area Prepped: Entire  Buttocks Region Prepping solution: See procedure #1. Safety Precautions: Same as per procedure #1. Description of the Procedure: The trigger area was identified and marked. Prepping and infiltration as per procedure #1. Appropriate amount of time allowed to pass for local anesthetics to take effect. The procedure needle was then advanced to the target area.  Negative aspiration confirmed. Solution injected in intermittent fashion. The needle was then removed and the area cleansed as per procedure #1. EBL: Minimal Materials & Medications Used:  Needle(s) Used: 25g - 1.5" Needle(s) Solution Injected: 0.2% PF-Ropivacaine (58m) + SDV-Triamcinolone 40 mg/ml (155m  Imaging Guidance:  Type of Imaging Technique:  None Indication(s): N/A  Antibiotics:  Type:  Antibiotics Given (last 72 hours)    None      Indication(s): No indications identified or reported.  Post-operative Assessment:  Complications: No immediate post-treatment complications were observed. Disposition: The patient was discharged home, once institutional criteria were met. Return to clinic in 2 weeks for follow-up evaluation and interpretation of results. The patient tolerated the entire procedure well. A repeat set of vitals were taken after the procedure and the patient was kept under observation following institutional policy, for this type of procedure. Post-procedural neurological assessment was performed, showing return to baseline, prior to discharge. The patient was provided with post-procedure discharge instructions, including a section on how to identify potential problems. Should any problems arise concerning this procedure, the patient was given instructions to immediately contact usKoreaat any time, without hesitation. In any case, we plan to contact the patient by telephone for a follow-up status report regarding this interventional procedure. Comments:  No additional relevant information.  Primary Care Physician: BAMarcello FennelMD [facility] Note by: FrKathlen BrunswickNaDossie ArbourM.D, DABA, DABAPM, DABPM, DABIPP, FIPP  Disclaimer:  Medicine is not an exact science. The only guarantee in medicine is that nothing is guaranteed. It is important to note that the decision to proceed with this intervention was based on the information collected from the patient. The Data and conclusions were drawn from the patient's questionnaire, the interview, and the physical examination. Because the information was provided in large part by the patient, it cannot be guaranteed that it has not been purposely or unconsciously manipulated. Every effort has been made to obtain as much relevant data as possible for  this evaluation. It is important to note that the conclusions that lead to this procedure are derived in large part from the available data. Always take into account that the treatment will also be dependent on availability of resources and existing treatment guidelines, considered by other Pain Management Practitioners as being common knowledge and practice, at the time of the intervention. For Medico-Legal purposes, it is also important to point out that variation in procedural techniques and pharmacological choices are the acceptable norm. The indications, contraindications, technique, and results of the above procedure should only be interpreted and judged by a Board-Certified Interventional Pain Specialist with extensive familiarity and expertise in the same exact procedure and technique. Attempts  at providing opinions without similar or greater experience and expertise than that of the treating physician will be considered as inappropriate and unethical, and shall result in a formal complaint to the state medical board and applicable specialty societies.

## 2015-01-13 DIAGNOSIS — G8929 Other chronic pain: Secondary | ICD-10-CM | POA: Insufficient documentation

## 2015-01-13 DIAGNOSIS — Z5181 Encounter for therapeutic drug level monitoring: Secondary | ICD-10-CM | POA: Insufficient documentation

## 2015-01-13 DIAGNOSIS — M961 Postlaminectomy syndrome, not elsewhere classified: Secondary | ICD-10-CM | POA: Insufficient documentation

## 2015-01-13 DIAGNOSIS — F1190 Opioid use, unspecified, uncomplicated: Secondary | ICD-10-CM | POA: Insufficient documentation

## 2015-01-13 DIAGNOSIS — M545 Low back pain, unspecified: Secondary | ICD-10-CM | POA: Insufficient documentation

## 2015-01-13 DIAGNOSIS — Z79891 Long term (current) use of opiate analgesic: Secondary | ICD-10-CM | POA: Insufficient documentation

## 2015-01-13 DIAGNOSIS — M79605 Pain in left leg: Secondary | ICD-10-CM

## 2015-01-13 DIAGNOSIS — F1120 Opioid dependence, uncomplicated: Secondary | ICD-10-CM | POA: Insufficient documentation

## 2015-01-13 DIAGNOSIS — M5416 Radiculopathy, lumbar region: Secondary | ICD-10-CM

## 2015-01-13 NOTE — Progress Notes (Signed)
Patient's Name: Alisha Mullen MRN: [id number] DOB: December 25, 1934 DOS: 01/01/2015  Primary Reason(s) for Visit: Initial Patient Evaluation. CC: Hip Pain   HPI:   Alisha Mullen is a 80 y.o. year old, female patient, who comes today for her initial evaluation. 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 Her primarily concern today is the Hip Pain  The patient describes the onset of symptoms as having started around March 2016. The location of the pain is primarily in the left lower extremity going all the way down to the ankle through the anterior lateral aspect of the leg in what seems to be a radicular distribution for L4. The patient also has a history significant for a lumbar spine surgery done on 2011 by Dr. Arnoldo Morale. The cause of the pain is described to be unknown. The severity of the pain is described as getting worse with the VAS score of 8/10 at its worse, a forward pain, and a 4/10 on the average. The timing of the pain seems to be worse during activity or exercise. Aggravating factors include sitting for prolonged periods of time, as well as standing for prolonged periods of time. Other aggravating factors include twisting and walking. Alleviating factors include laying down, taking medications, and resting. Associated problems include tingling, and pain that wakes her up. The quality  of the pain is described to be a burning, deep, horrible, sharp, stabbing, throbbing type of sensation. Previous examinations are tests have included MRI scan and x-rays. The patient denies any prior treatments to this pain. Today's Pain Score: 6  Reported level of pain is compatible with clinical observation Pain Type: Chronic pain Pain Location: Hip Pain Orientation: Left Pain Descriptors / Indicators: Sharp, Aching, Throbbing Pain Frequency: Constant  Date of Last Visit:   Service Provided on Last Visit:    Pharmacotherapy Review: The patient's medication management is currently not under our care. Side-effects or Adverse reactions: None reported. Effectiveness: Described as relatively effective, allowing for increase in activities of daily living (ADL). Onset of action: Within expected pharmacological parameters. Duration of action: Within normal limits for medication. Peak effect: Timing and results are as within normal expected parameters. [place] PMP: Compliant with practice rules and regulations. Last UDS available in the system: No results found for: LABOPIA, COCAINSCRNUR, LABBENZ, AMPHETMU, THCU, LABBARB  No components found for: DRUGS OF ABUSE SCREEN W/O ALC DST: Compliant with practice rules and regulations. Lab work: No new labs ordered by our practice. Treatment compliance: Compliant. Substance Use Disorder (SUD) Risk Level: Low Planned course of action: Continue therapy as is.  Allergies: Alisha Mullen is allergic to demerol; metoprolol; codeine; lotrel; norvasc; penicillins; sulfa antibiotics; amlodipine; vioxx; hyzaar; lipitor; and loratadine.  Meds: The patient has a current medication list which includes the following prescription(s): acetaminophen, carbamazepine, vitamin d-3, clindamycin, gabapentin, meloxicam, potassium chloride, simvastatin, torsemide, and fentanyl, and the following Facility-Administered Medications: fentanyl, lidocaine (pf), methylprednisolone acetate,  methylprednisolone acetate, midazolam, ropivacaine (pf) 2 mg/ml (0.2%), and ropivacaine (pf) 2 mg/ml (0.2%). Requested Prescriptions  Signed Prescriptions Disp Refills  . fentaNYL (DURAGESIC - DOSED MCG/HR) 12 MCG/HR 10 patch 0    Sig: Place 1 patch (12.5 mcg total) onto the skin every 3 (three) days.    ROS: Constitutional: Afebrile, no chills, well hydrated and well nourished Cardiovascular: The patient indicates needing antibiotics prior to dental work the patient denies any hypertension, chest pain, myocardial infarction, or heart disease. Pulmonary or respiratory history: Positive only for snoring. The patient denies any lung problems, bronchial asthma, emphysema, or being a smoker. Neurological history: Positive for seizure disorder and scoliosis. Psychological and psychiatric history: Negative for any psychiatric disorder, anxiety, depression and panic attacks. Negative for suicidal ideations or attempts. Gastrointestinal history: Positive for irritable bowel syndrome. Negative for peptic ulcer disease, hiatal hernia, or gastroesophageal reflux disease. Asian also denies hepatitis, cirrhosis, pancreatitis, or chronic constipation. Genitourinary history: Negative for kidney disease, renal failure, nephrolithiasis, hematuria, or recurring urinary tract infections. Hematological history: Negative for anemia, bruising easily, bleeding easily, hemophilia, sickle cell disease or trait, coagulopathies, or low platelet counts. Endocrine history: Negative for diabetes or thyroid disease. Rheumatological history: Negative for lupus, osteoarthritis, rheumatoid arthritis, fibromyalgia, myositis, polymyositis, or chronic fatigue syndrome. Musculoskeletal history: Negative for myasthenia gravis, muscular dystrophy, multiple sclerosis, or malignant hyperthermia.  PFSH: Medical:  Alisha Mullen  has a past medical history of Chronic kidney disease; Seizures ([place]); Arthritis; and Cancer ([place]). Family:  family history includes Cancer in her mother; Heart disease in her father; Hypertension in her mother. Surgical:  has past surgical history that includes Breast surgery; Cholecystectomy; Appendectomy; Tonsillectomy; Carpal tunnel release; Foot surgery (Right, 2005); Shoulder surgery (Right); Total shoulder replacement (Left, 2007); Replacement total knee (Left, 2001); Cataract extraction bilateral w/ anterior vitrectomy (Bilateral); Back surgery (2011); and Abdominal hysterectomy. Tobacco:  reports that she has never smoked. She does not have any smokeless tobacco history on file. Alcohol:  reports that she does not drink alcohol. Drug:  reports that she does not use illicit drugs.  Physical Exam: Vitals:  Today's Vitals   01/01/15 1319 01/01/15 1322  BP: 181/52   Pulse: 70   Temp: 98.4 F (36.9 C)   Resp: 18   Height: 5' 3.5" (1.613 m)   Weight: 205 lb (92.987 kg)   SpO2: 99%   PainSc: 6  6   PainLoc: Hip   Calculated BMI: Body mass index is 35.74 kg/(m^2). General appearance: alert, cooperative, appears older than stated age, mild distress and morbidly obese Eyes: conjunctivae/corneas clear. PERRL, EOM's intact. Fundi benign. Lungs: No evidence respiratory distress, no audible rales or ronchi and no use of accessory muscles of respiration Neck: no adenopathy, no carotid bruit, no JVD, supple, symmetrical, trachea midline and thyroid not enlarged, symmetric, no tenderness/mass/nodules Back: The patient has evidence of a prior surgery in the form of a scar in the midline of the lower back. She also presents with some tenderness to palpation over the PSIS, especially in the left side. Extremities: Right lower extremity is completely within normal limits. Left lower extremity percent with the dermatomal pain however, the patient was able to toe walk and heel walk without any problems. Strength seems to be within normal limits for all major flexors and extensors of the lower extremity. DTRs  were present. Pulses were also present. Pulses: 2+ and symmetric Skin: Skin color, texture, turgor normal. No rashes or lesions Neurologic: Gait: Antalgic    Assessment: Encounter Diagnosis:  Primary Diagnosis: Chronic pain of left lower extremity [M79.605, G89.29]  Plan: Analeise was seen today  for hip pain.  Diagnoses and all orders for this visit:  Chronic pain of left lower extremity  Chronic pain -     COMPLETE METABOLIC PANEL WITH GFR; Future -     C-reactive protein; Future -     Cancel: Magnesium -     Sedimentation rate; Future -     Vitamin D2,D3 Panel; Future  Chronic left hip pain -     MR Hip Left Wo Contrast; Future -     HIP INJECTION; Future  Other long term (current) drug therapy -     Cancel: Drugs of abuse screen w/o alc, rtn urine-sln  History of lumbosacral spine surgery -     MR Lumbar Spine Wo Contrast; Future  Long term current use of opiate analgesic  Long term prescription opiate use  Opiate use  Uncomplicated opioid dependence ([place])  Encounter for therapeutic drug level monitoring  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     Patient Instructions  Nothing to eat or drink 6 hours before your appointment. You will not be able to drive for 24 hours after the procedure so you will need to bring a driver with you to your appointment.   A prescription for DURAGESIC was given to you today.     Medications discontinued today:  Medications Discontinued During This Encounter  Medication Reason  . acetaminophen (TYLENOL) 500 MG tablet Error  . calcium carbonate (OS-CAL) 1250 (500 CA) MG chewable tablet Error  . HYDROcodone-acetaminophen (NORCO/VICODIN) 5-325 MG per tablet Error  . ibuprofen (ADVIL,MOTRIN) 200 MG tablet Error  .  simvastatin (ZOCOR) 10 MG tablet Error   Medications administered today:  Alisha Mullen had no medications administered during this visit.  Primary Care Physician: Marcello Fennel, MD Location: [facility] Facility Note by: Kathlen Brunswick. Dossie Arbour, M.D, DABA, DABAPM, DABPM, DABIPP, FIPP

## 2015-01-20 ENCOUNTER — Ambulatory Visit
Admission: RE | Admit: 2015-01-20 | Discharge: 2015-01-20 | Disposition: A | Discharge status: Home / Self Care | Payer: Medicare Other | Source: Ambulatory Visit | Attending: Pain Medicine | Admitting: Pain Medicine

## 2015-01-20 DIAGNOSIS — G8929 Other chronic pain: Secondary | ICD-10-CM | POA: Diagnosis present

## 2015-01-20 DIAGNOSIS — M129 Arthropathy, unspecified: Secondary | ICD-10-CM | POA: Insufficient documentation

## 2015-01-20 DIAGNOSIS — M899 Disorder of bone, unspecified: Secondary | ICD-10-CM | POA: Insufficient documentation

## 2015-01-20 DIAGNOSIS — M4806 Spinal stenosis, lumbar region: Secondary | ICD-10-CM | POA: Insufficient documentation

## 2015-01-20 DIAGNOSIS — M25552 Pain in left hip: Principal | ICD-10-CM

## 2015-01-20 DIAGNOSIS — Z9889 Other specified postprocedural states: Secondary | ICD-10-CM | POA: Diagnosis present

## 2015-01-20 DIAGNOSIS — M5136 Other intervertebral disc degeneration, lumbar region: Secondary | ICD-10-CM | POA: Diagnosis not present

## 2015-01-20 DIAGNOSIS — M67854 Other specified disorders of tendon, left hip: Secondary | ICD-10-CM | POA: Insufficient documentation

## 2015-01-20 DIAGNOSIS — M898X5 Other specified disorders of bone, thigh: Secondary | ICD-10-CM | POA: Diagnosis not present

## 2015-01-23 ENCOUNTER — Encounter: Payer: Self-pay | Admitting: Pain Medicine

## 2015-01-23 ENCOUNTER — Ambulatory Visit: Payer: Medicare Other | Attending: Pain Medicine | Admitting: Pain Medicine

## 2015-01-23 ENCOUNTER — Other Ambulatory Visit: Payer: Self-pay | Admitting: Pain Medicine

## 2015-01-23 VITALS — BP 160/64 | HR 71 | Temp 98.6°F | Resp 18 | Ht 64.0 in | Wt 202.0 lb

## 2015-01-23 DIAGNOSIS — Z5181 Encounter for therapeutic drug level monitoring: Secondary | ICD-10-CM

## 2015-01-23 DIAGNOSIS — M545 Low back pain: Secondary | ICD-10-CM | POA: Insufficient documentation

## 2015-01-23 DIAGNOSIS — M48061 Spinal stenosis, lumbar region without neurogenic claudication: Secondary | ICD-10-CM

## 2015-01-23 DIAGNOSIS — M25559 Pain in unspecified hip: Secondary | ICD-10-CM | POA: Diagnosis present

## 2015-01-23 DIAGNOSIS — M6790 Unspecified disorder of synovium and tendon, unspecified site: Secondary | ICD-10-CM

## 2015-01-23 DIAGNOSIS — M4807 Spinal stenosis, lumbosacral region: Secondary | ICD-10-CM | POA: Insufficient documentation

## 2015-01-23 DIAGNOSIS — F1190 Opioid use, unspecified, uncomplicated: Secondary | ICD-10-CM | POA: Diagnosis not present

## 2015-01-23 DIAGNOSIS — E7800 Pure hypercholesterolemia, unspecified: Secondary | ICD-10-CM | POA: Insufficient documentation

## 2015-01-23 DIAGNOSIS — M47816 Spondylosis without myelopathy or radiculopathy, lumbar region: Secondary | ICD-10-CM | POA: Insufficient documentation

## 2015-01-23 DIAGNOSIS — G40909 Epilepsy, unspecified, not intractable, without status epilepticus: Secondary | ICD-10-CM | POA: Insufficient documentation

## 2015-01-23 DIAGNOSIS — M25552 Pain in left hip: Secondary | ICD-10-CM

## 2015-01-23 DIAGNOSIS — G8929 Other chronic pain: Secondary | ICD-10-CM | POA: Diagnosis not present

## 2015-01-23 DIAGNOSIS — Z79899 Other long term (current) drug therapy: Secondary | ICD-10-CM

## 2015-01-23 DIAGNOSIS — M961 Postlaminectomy syndrome, not elsewhere classified: Secondary | ICD-10-CM | POA: Insufficient documentation

## 2015-01-23 DIAGNOSIS — M810 Age-related osteoporosis without current pathological fracture: Secondary | ICD-10-CM | POA: Diagnosis not present

## 2015-01-23 DIAGNOSIS — Z79891 Long term (current) use of opiate analgesic: Secondary | ICD-10-CM

## 2015-01-23 DIAGNOSIS — M6780 Other specified disorders of synovium and tendon, unspecified site: Secondary | ICD-10-CM | POA: Insufficient documentation

## 2015-01-23 DIAGNOSIS — M4806 Spinal stenosis, lumbar region: Secondary | ICD-10-CM | POA: Insufficient documentation

## 2015-01-23 DIAGNOSIS — M47896 Other spondylosis, lumbar region: Secondary | ICD-10-CM

## 2015-01-23 DIAGNOSIS — M1612 Unilateral primary osteoarthritis, left hip: Secondary | ICD-10-CM | POA: Insufficient documentation

## 2015-01-23 DIAGNOSIS — M5416 Radiculopathy, lumbar region: Secondary | ICD-10-CM | POA: Diagnosis not present

## 2015-01-23 DIAGNOSIS — I10 Essential (primary) hypertension: Secondary | ICD-10-CM | POA: Diagnosis not present

## 2015-01-23 DIAGNOSIS — M159 Polyosteoarthritis, unspecified: Secondary | ICD-10-CM

## 2015-01-23 DIAGNOSIS — M791 Myalgia: Secondary | ICD-10-CM

## 2015-01-23 DIAGNOSIS — E669 Obesity, unspecified: Secondary | ICD-10-CM | POA: Insufficient documentation

## 2015-01-23 DIAGNOSIS — Z9889 Other specified postprocedural states: Secondary | ICD-10-CM | POA: Diagnosis not present

## 2015-01-23 DIAGNOSIS — M79605 Pain in left leg: Secondary | ICD-10-CM | POA: Insufficient documentation

## 2015-01-23 DIAGNOSIS — M7918 Myalgia, other site: Secondary | ICD-10-CM

## 2015-01-23 DIAGNOSIS — F1120 Opioid dependence, uncomplicated: Secondary | ICD-10-CM

## 2015-01-23 DIAGNOSIS — M150 Primary generalized (osteo)arthritis: Secondary | ICD-10-CM

## 2015-01-23 DIAGNOSIS — M1990 Unspecified osteoarthritis, unspecified site: Secondary | ICD-10-CM | POA: Insufficient documentation

## 2015-01-23 DIAGNOSIS — M4726 Other spondylosis with radiculopathy, lumbar region: Secondary | ICD-10-CM

## 2015-01-23 MED ORDER — FENTANYL 12 MCG/HR TD PT72: 12.5000 ug | MEDICATED_PATCH | TRANSDERMAL | Status: DC

## 2015-01-23 NOTE — Progress Notes (Signed)
Patient's Name: Alisha Mullen MRN: [id number] DOB: 04/27/1934 DOS: 01/23/2015  Primary Reason(s) for Visit: Post-Procedure evaluation. Medication management. CC: Hip Pain   HPI:   Ms. Dutter is a 80 y.o. year old, female patient, who returns today as an established patient. 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 Her primarily concern today is the Hip Pain     The patient returns to the clinic today after having had a left-sided intra-articular hip injection under fluoroscopic guidance and a left-sided trigger point injection in the posterolateral aspect of her buttocks, with IV sedation. The patient indicates having attained great relief of the pain and today she comes into the screws to results of her lumbar and hip MRIs as well as to refill her medications and talk about her options. Today we have just on that, at length. Because she is doing rather well, we'll continue to manage her with the medication and we will not plan on doing any further injections unless the pain  comes back. Because of this, I have put up by a PRN order for repeating this procedure. The patient's hip is currently being managed by Dr. Christophe Louis while the spinal problems are being managed by Dr. Newman Pies.  Today's Pain Score: 2  Reported level of pain is incompatible with clinical obrservations. This may be secondary to a possible lack of understanding on how the pain scale works. Pain Type: Chronic pain Pain Location: Hip Pain Orientation: Left Pain Descriptors / Indicators: Dull, Aching, Constant Pain Frequency: Constant  Date of Last Visit: 01/09/15 Service Provided on Last Visit: Procedure  Pharmacotherapy Review:   Side-effects or Adverse reactions: None reported. Effectiveness: Described as relatively effective, allowing for increase in activities of daily living (ADL). Onset of action: Within expected pharmacological parameters. Duration of action: Within normal limits for medication. Peak effect: Timing and results are as within normal expected parameters. [place] PMP: Compliant with practice rules and regulations. UDS: Compliant with practice rules and regulations. Treatment compliance: Compliant. Substance Use Disorder (SUD) Risk Level: Low Planned course of action: Continue therapy as is.  Last Available Lab Work: Hospital Outpatient Visit on 01/03/2015  Component Date Value Ref Range Status  . CRP 01/03/2015 <0.5  <1.0 mg/dL Final  . Sed Rate 01/03/2015 15  0 - 30 mm/hr Final  . Magnesium 01/03/2015 2.0  1.7 - 2.4 mg/dL Final  . Sodium 01/03/2015 143  135 - 145 mmol/L Final  . Potassium 01/03/2015 3.8  3.5 - 5.1 mmol/L Final  . Chloride 01/03/2015 108  101 - 111 mmol/L Final  . CO2 01/03/2015 26  22 - 32 mmol/L Final  . Glucose, Bld 01/03/2015 96  65 - 99 mg/dL Final  . BUN 01/03/2015 19  6 - 20 mg/dL Final  .  Creatinine, Ser 01/03/2015 0.80  0.44 - 1.00 mg/dL Final  . Calcium 01/03/2015 9.1  8.9 - 10.3 mg/dL Final  . Total Protein 01/03/2015 6.5   6.5 - 8.1 g/dL Final  . Albumin 01/03/2015 3.8  3.5 - 5.0 g/dL Final  . AST 01/03/2015 19  15 - 41 U/L Final  . ALT 01/03/2015 11* 14 - 54 U/L Final  . Alkaline Phosphatase 01/03/2015 57  38 - 126 U/L Final  . Total Bilirubin 01/03/2015 0.6  0.3 - 1.2 mg/dL Final  . GFR calc non Af Amer 01/03/2015 >60  >60 mL/min Final  . GFR calc Af Amer 01/03/2015 >60  >60 mL/min Final  . Anion gap 01/03/2015 9  5 - 15 Final  . 25-Hydroxy, Vitamin D 01/03/2015 21*  Final  . 25-Hydroxy, Vitamin D-2 01/03/2015 <1.0   Final  . 25-Hydroxy, Vitamin D-3 01/03/2015 21   Final   Post-Procedure Assessment:  Procedure done on last visit: Left intra-articular hip injection under fluoroscopic guidance and IV sedation plus left posterolateral trigger point injection of the gluteus muscle. Side-effects or Adverse reactions: No significant issues reported. Sedation: No sedation used during procedure.  Results: Ultra-Short Term Relief (First 1 hour after procedure): 100 % (not numb) Short Term Relief (Initial 4-6 hrs after procedure): 100 % (starting to feel numb) Long Term Relief : 80 % (pain just starting to return)  Current Relief (Now):  80 % Interpretation of Results: Short-term relief confirms injected site as etiology of pain. Long term relief is possibly due to sympathetic blockade, or the effects of steroids, if administered during procedure. Persistent relief would suggest effective anti-inflammatory effects from steroids.  If necessary, we will repeat the same procedure when necessary.  Allergies: Ms. Hackenburg is allergic to demerol; metoprolol; codeine; lotrel; norvasc; penicillins; sulfa antibiotics; amlodipine; vioxx; hyzaar; lipitor; and loratadine.  Meds: The patient has a current medication list which includes the following prescription(s): acetaminophen, carbamazepine, vitamin d-3, clindamycin, fentanyl, gabapentin, meloxicam, potassium chloride, simvastatin, torsemide, fentanyl, and  fentanyl. Requested Prescriptions   Signed Prescriptions Disp Refills  . fentaNYL (DURAGESIC - DOSED MCG/HR) 12 MCG/HR 10 patch 0    Sig: Place 1 patch (12.5 mcg total) onto the skin every 3 (three) days.  . fentaNYL (DURAGESIC - DOSED MCG/HR) 12 MCG/HR 10 patch 0    Sig: Place 1 patch (12.5 mcg total) onto the skin every 3 (three) days.  . fentaNYL (DURAGESIC - DOSED MCG/HR) 12 MCG/HR 10 patch 0    Sig: Place 1 patch (12.5 mcg total) onto the skin every 3 (three) days.    ROS: Constitutional: Afebrile, no chills, well hydrated and well nourished Gastrointestinal: negative Musculoskeletal:negative Neurological: negative Behavioral/Psych: negative  PFSH: Medical:  Ms. Knowlden  has a past medical history of Chronic kidney disease; Seizures ([place]); Arthritis; and Cancer ([place]). Family: family history includes Cancer in her mother; Heart disease in her father; Hypertension in her mother. Surgical:  has past surgical history that includes Breast surgery; Cholecystectomy; Appendectomy; Tonsillectomy; Carpal tunnel release; Foot surgery (Right, 2005); Shoulder surgery (Right); Total shoulder replacement (Left, 2007); Replacement total knee (Left, 2001); Cataract extraction bilateral w/ anterior vitrectomy (Bilateral); Back surgery (2011); and Abdominal hysterectomy. Tobacco:  reports that she has never smoked. She does not have any smokeless tobacco history on file. Alcohol:  reports that she does not drink alcohol. Drug:  reports that she does not use illicit drugs.  Physical Exam: Vitals:  Today's Vitals   01/23/15 1435 01/23/15 1439  BP: 160/64  Pulse: 71   Temp: 98.6 F (37 C)   TempSrc: Oral   Resp: 18   Height: 5\' 4"  (1.626 m)   Weight: 202 lb (91.627 kg)   SpO2: 97%   PainSc: 2  2   PainLoc: Hip   Calculated BMI: Body mass index is 34.66 kg/(m^2). General appearance: alert, cooperative, appears stated age, no distress and moderately obese Eyes: conjunctivae/corneas clear.  PERRL, EOM's intact. Fundi benign. Lungs: No evidence respiratory distress, no audible rales or ronchi and no use of accessory muscles of respiration Neck: no adenopathy, no carotid bruit, no JVD, supple, symmetrical, trachea midline and thyroid not enlarged, symmetric, no tenderness/mass/nodules Back: The patient has clear evidence of in the lumbar spine of prior surgery and fusion with some decreased range of motion. Extremities: Mild edema of both lower extremities. No major issues detected. Pulses: 2+ and symmetric Skin: Skin color, texture, turgor normal. No rashes or lesions Neurologic: Gait: Antalgic. The patient ambulates using a cane.    Assessment: Encounter Diagnosis:  Primary Diagnosis: Chronic pain [G89.29]  Plan: Tanis was seen today for hip pain.  Diagnoses and all orders for this visit:  Chronic pain -     fentaNYL (DURAGESIC - DOSED MCG/HR) 12 MCG/HR; Place 1 patch (12.5 mcg total) onto the skin every 3 (three) days. -     fentaNYL (DURAGESIC - DOSED MCG/HR) 12 MCG/HR; Place 1 patch (12.5 mcg total) onto the skin every 3 (three) days. -     fentaNYL (DURAGESIC - DOSED MCG/HR) 12 MCG/HR; Place 1 patch (12.5 mcg total) onto the skin every 3 (three) days.  Chronic low back pain  Chronic lumbar radicular pain (left side)  Chronic pain of left lower extremity  Myofascial pain syndrome (left buttocks area) -     TRIGGER POINT INJECTION; Standing  Encounter for therapeutic drug level monitoring  Long term current use of opiate analgesic -     Drugs of abuse screen w/o alc, rtn urine-sln -     Drugs of abuse screen w/o alc, rtn urine-sln; Future  Long term prescription opiate use  Uncomplicated opioid dependence ([facility])  Chronic left hip pain -     HIP INJECTION; Standing  Gluteal muscle Tendinosis  Primary osteoarthritis of left hip  Obesity, Class II, BMI 35-39.9  Osteoarthritis of spine with radiculopathy, lumbar region  Primary osteoarthritis  involving multiple joints  Lumbar spinal stenosis (Mild L2-3, L3-4, L4-5)  Lumbosacral foraminal stenosis (Left L1-2, L2-3)  Lumbar facet hypertrophy (Bilateral L3-4 and L4-5)  Lumbar postlaminectomy syndrome (L4 laminectomy and L4-5 PLIF)     There are no Patient Instructions on file for this visit. Medications discontinued today:  Medications Discontinued During This Encounter  Medication Reason  . methylPREDNISolone acetate (DEPO-MEDROL) injection 40 mg   . ropivacaine (PF) 2 mg/ml (0.2%) (NAROPIN) epidural 9 mL   . fentaNYL (SUBLIMAZE) injection 100 mcg   . lidocaine (PF) (XYLOCAINE) 1 % injection 10 mL   . methylPREDNISolone acetate (DEPO-MEDROL) injection 80 mg   . midazolam (VERSED) 5 MG/5ML injection 5 mg   . ropivacaine (PF) 2 mg/ml (0.2%) (NAROPIN) epidural 9 mL   . fentaNYL (DURAGESIC - DOSED MCG/HR) 12 MCG/HR Reorder   Medications administered today:  Ms. Hulsman had no medications administered during this visit.  Primary Care Physician: Marcello Fennel, MD Location: [facility] Note by: Kathlen Brunswick. Dossie Arbour, M.D, DABA, DABAPM, DABPM, DABIPP, FIPP

## 2015-01-23 NOTE — Progress Notes (Signed)
Safety precautions to be maintained throughout the outpatient stay will include: orient to surroundings, keep bed in low position, maintain call bell within reach at all times, provide assistance with transfer out of bed and ambulation.  Fentanyl patch count = 3 patches

## 2015-01-27 ENCOUNTER — Other Ambulatory Visit: Payer: Self-pay | Admitting: Pain Medicine

## 2015-01-31 LAB — TOXASSURE SELECT 13 (MW), URINE: PDF: 0

## 2015-02-04 ENCOUNTER — Other Ambulatory Visit: Payer: Self-pay | Admitting: Family Medicine

## 2015-02-04 DIAGNOSIS — Z1231 Encounter for screening mammogram for malignant neoplasm of breast: Secondary | ICD-10-CM

## 2015-02-11 ENCOUNTER — Other Ambulatory Visit: Payer: Self-pay | Admitting: Pain Medicine

## 2015-02-11 DIAGNOSIS — M48061 Spinal stenosis, lumbar region without neurogenic claudication: Secondary | ICD-10-CM | POA: Insufficient documentation

## 2015-02-11 DIAGNOSIS — M4310 Spondylolisthesis, site unspecified: Secondary | ICD-10-CM | POA: Insufficient documentation

## 2015-02-11 NOTE — Progress Notes (Signed)
Quick Note:  Abnormal findings detected on recent study. I range for neurosurgical consult and send copies of MRI. ______

## 2015-02-11 NOTE — Progress Notes (Signed)
Quick Note:  Abnormal findings detected on recent study. Arrange for this patient to be seen by an orthopedic surgeon. Please send MRI results. ______

## 2015-02-11 NOTE — Progress Notes (Signed)
Quick Note:  Normal levels of Vitamin D for our Lab are between 30 and 100 ng/mL. The results of this test indicate that this patient has low levels of Vitamin D, compatible with a deficiency (<20 ng/ml), and/or insufficiency (20-30 ng/ml). Common causes include: dietary insufficiency; inadequate sun exposure; inability to absorb vitamin D from the intestines; or inability to process it due to kidney or liver disease. Associated complications may include hypocalcemia, hypophosphatemia, and reduced bone density. In addition, it is associated with fatigue, weakness, bone pain, joint pain, and muscle pain. The patient may benefit from taking over-the-counter Vitamin D3 supplements. I recommend 2,000 IU once a day, preferably with a Calcium supplement. ______ 

## 2015-02-12 ENCOUNTER — Ambulatory Visit
Admission: RE | Admit: 2015-02-12 | Discharge: 2015-02-12 | Disposition: A | Discharge status: Home / Self Care | Payer: Medicare Other | Source: Ambulatory Visit | Attending: Family Medicine | Admitting: Family Medicine

## 2015-02-12 ENCOUNTER — Other Ambulatory Visit: Payer: Self-pay | Admitting: Family Medicine

## 2015-02-12 DIAGNOSIS — Z1231 Encounter for screening mammogram for malignant neoplasm of breast: Secondary | ICD-10-CM | POA: Insufficient documentation

## 2015-02-14 ENCOUNTER — Other Ambulatory Visit: Payer: Self-pay | Admitting: Pain Medicine

## 2015-02-26 ENCOUNTER — Other Ambulatory Visit: Payer: Self-pay | Admitting: Pain Medicine

## 2015-04-01 ENCOUNTER — Ambulatory Visit: Payer: Medicare Other | Attending: Pain Medicine | Admitting: Pain Medicine

## 2015-04-01 ENCOUNTER — Encounter: Payer: Self-pay | Admitting: Pain Medicine

## 2015-04-01 VITALS — BP 117/41 | HR 65 | Temp 97.8°F | Resp 16 | Ht 64.0 in | Wt 204.0 lb

## 2015-04-01 DIAGNOSIS — E669 Obesity, unspecified: Secondary | ICD-10-CM | POA: Insufficient documentation

## 2015-04-01 DIAGNOSIS — I10 Essential (primary) hypertension: Secondary | ICD-10-CM | POA: Insufficient documentation

## 2015-04-01 DIAGNOSIS — M4806 Spinal stenosis, lumbar region: Secondary | ICD-10-CM | POA: Diagnosis not present

## 2015-04-01 DIAGNOSIS — M4316 Spondylolisthesis, lumbar region: Secondary | ICD-10-CM | POA: Diagnosis not present

## 2015-04-01 DIAGNOSIS — M4807 Spinal stenosis, lumbosacral region: Secondary | ICD-10-CM | POA: Diagnosis not present

## 2015-04-01 DIAGNOSIS — F1190 Opioid use, unspecified, uncomplicated: Secondary | ICD-10-CM | POA: Insufficient documentation

## 2015-04-01 DIAGNOSIS — M1612 Unilateral primary osteoarthritis, left hip: Secondary | ICD-10-CM | POA: Diagnosis not present

## 2015-04-01 DIAGNOSIS — M961 Postlaminectomy syndrome, not elsewhere classified: Secondary | ICD-10-CM | POA: Insufficient documentation

## 2015-04-01 DIAGNOSIS — G8929 Other chronic pain: Secondary | ICD-10-CM | POA: Diagnosis not present

## 2015-04-01 DIAGNOSIS — E7800 Pure hypercholesterolemia, unspecified: Secondary | ICD-10-CM | POA: Diagnosis not present

## 2015-04-01 DIAGNOSIS — M25552 Pain in left hip: Secondary | ICD-10-CM | POA: Insufficient documentation

## 2015-04-01 DIAGNOSIS — M47816 Spondylosis without myelopathy or radiculopathy, lumbar region: Secondary | ICD-10-CM | POA: Insufficient documentation

## 2015-04-01 DIAGNOSIS — M25559 Pain in unspecified hip: Secondary | ICD-10-CM | POA: Diagnosis present

## 2015-04-01 MED ORDER — IOHEXOL 180 MG/ML  SOLN: 10.0000 mL | Freq: Once | INTRAMUSCULAR | Status: AC | PRN

## 2015-04-01 MED ORDER — ROPIVACAINE HCL 2 MG/ML IJ SOLN
4.0000 mL | Freq: Once | INTRAMUSCULAR | Status: AC
  Administered 2015-04-01: 4 mL

## 2015-04-01 MED ORDER — MIDAZOLAM HCL 5 MG/5ML IJ SOLN
5.0000 mg | Freq: Once | INTRAMUSCULAR | Status: AC
  Administered 2015-04-01: 3 mg via INTRAVENOUS

## 2015-04-01 MED ORDER — FENTANYL CITRATE (PF) 100 MCG/2ML IJ SOLN
100.0000 ug | Freq: Once | INTRAMUSCULAR | Status: AC
  Administered 2015-04-01: 50 ug via INTRAVENOUS

## 2015-04-01 MED ORDER — LACTATED RINGERS IV SOLN
1000.0000 mL | INTRAVENOUS | Status: AC
  Administered 2015-04-01: 1000 mL via INTRAVENOUS

## 2015-04-01 MED ORDER — METHYLPREDNISOLONE ACETATE 80 MG/ML IJ SUSP
80.0000 mg | Freq: Once | INTRAMUSCULAR | Status: AC
  Administered 2015-04-01: 80 mg via INTRA_ARTICULAR

## 2015-04-01 MED ORDER — LIDOCAINE HCL (PF) 1 % IJ SOLN
10.0000 mL | Freq: Once | INTRAMUSCULAR | Status: AC
  Administered 2015-04-01: 10 mL via SUBCUTANEOUS

## 2015-04-01 NOTE — Progress Notes (Signed)
Safety precautions to be maintained throughout the outpatient stay will include: orient to surroundings, keep bed in low position, maintain call bell within reach at all times, provide assistance with transfer out of bed and ambulation.  

## 2015-04-01 NOTE — Patient Instructions (Signed)

## 2015-04-01 NOTE — Progress Notes (Signed)
Patient's Name: Alisha Mullen MRN: [id number] DOB: [birth year] DOS: 04/01/2015  Primary Reason(s) for Visit: Interventional Pain Management Treatment. CC: Hip Pain I did  Pre-Procedure Assessment:  Ms. Alisha Mullen is a 81 y.o. year old, female patient, seen today for interventional treatment. 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 Her primarily concern today is the Hip Pain   The patient returns today indicating that she has decided to proceed with the hip replacement, but the pain is too much to bear and she would like to have a palliative injection done today until she can get to see the orthopedic surgeon. She actually has some bloody mind that apparently dusted replacements through the front as opposed to through the side.  Today's initial pain score is 6/10. Postprocedure Pain Score: 0-No pain Reported level of pain is compatible with clinical  observation Pain Type: Chronic pain Pain Location: Hip Pain Orientation: Left Pain Descriptors / Indicators: Sharp Pain Frequency: Constant  Date of Last Visit: 01/23/15 Service Provided on Last Visit: Med Refill  Verification of the correct person, correct site (including marking of site), and correct procedure were performed and confirmed by the patient.  Today's Vitals   04/01/15 1151 04/01/15 1202 04/01/15 1211 04/01/15 1220  BP: 153/67 114/54 102/45 117/41  Pulse: 65 66 63 65  Temp:      Resp: '16 16 16 16  '$ Height:      Weight:      SpO2: 97% 100% 98% 98%  PainSc: Asleep   0-No pain  PainLoc:      Calculated BMI: Body mass index is 35 kg/(m^2). Allergies: She is allergic to demerol; metoprolol; codeine; lotrel; norvasc; penicillins; sulfa antibiotics; amlodipine; vioxx; hyzaar; lipitor; and loratadine.. Primary Diagnosis: Chronic left hip pain [M25.552, G89.29]  Procedure:  Type: Palliative Intra-Articular Hip Injection Region:  Posterolateral hip joint area. Level: Lower pelvic and hip joint level. Laterality: Left-Sided  Indications: 1. Chronic left hip pain   2. Primary osteoarthritis of left hip     In addition, Ms. 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  Consent: Secured. Under the influence of no sedatives a written informed  consent was obtained, after having  provided information on the risks and possible complications. To fulfill our ethical and legal obligations, as recommended by the American Medical Association's Code of Ethics, we have provided information to the patient about our clinical impression; the nature and purpose of the treatment or procedure; the risks, benefits, and possible complications of the intervention; alternatives; the risk(s) and benefit(s) of the alternative treatment(s) or procedure(s); and the risk(s) and benefit(s) of doing nothing. The patient was provided information about the risks and possible complications associated with the procedure. These include, but are not limited to, failure to achieve desired goals, infection, bleeding, organ or nerve damage, allergic reactions, paralysis, and death. In the case of intra- or periarticular procedures these may include, but are not limited to, failure to achieve desired goals, infection, bleeding (hemarthrosis), organ or nerve damage, allergic reactions, and death. In addition, the patient was informed that Medicine is not an exact science; therefore, there is also the possibility of unforeseen risks and possible complications that may result in a catastrophic outcome. The patient indicated having understood very clearly. We have given the patient no guarantees and we have made no promises. Enough time was given to the patient to ask questions, all of which were answered to the patient's satisfaction.  Pre-Procedure Preparation: Safety Precautions: Allergies reviewed. Appropriate site, procedure, and patient were confirmed by following the Joint Commission's Universal Protocol (UP.01.01.01), in the form of a "Time Out". The patient was asked to confirm marked site and procedure, before commencing. The patient was asked about blood thinners, or active infections, both of which were denied. Patient was assessed for positional comfort and all pressure points  were checked before starting procedure. Monitoring:  As per clinic protocol. Infection Control Precautions: Sterile technique used. Standard Universal Precautions were taken as recommended by the Department of [facility] for Disease Control and Prevention (CDC). Standard pre-surgical skin prep was conducted. Respiratory hygiene and cough etiquette was practiced. Hand hygiene observed. Safe injection practices and needle disposal techniques followed. SDV (single dose vial) medications used. Medications properly checked for expiration dates and contaminants. Personal protective equipment (PPE) used: Radiation resistant gloves.  Anesthesia, Analgesia, Anxiolysis: Type: Moderate (Conscious) Sedation & Local Anesthesia Local Anesthetic: Lidocaine 1% Route: Intravenous (IV) IV Access: Secured Sedation: Meaningful verbal contact was maintained at all times during the procedure  Indication(s): Analgesia & Anxiolysis  Description of Procedure Process:  Time-out: "Time-out" completed before starting procedure, as per protocol. Position: Prone Target Area: Superior aspect of the hip joint cavity, going thru the superior portion of the capsular ligament. Approach: Posterolateral approach. Area Prepped: Entire Posterolateral hip area. Prepping solution: ChloraPrep (2% chlorhexidine gluconate and 70% isopropyl alcohol) Safety Precautions: Aspiration looking for blood return was conducted prior to all injections. At no point did we inject any substances, as a needle was being advanced. No attempts were made at seeking any paresthesias. Safe injection practices and needle disposal techniques used. Medications properly checked for expiration dates. SDV (single dose vial) medications used.   Description of the Procedure: Protocol guidelines were followed. The patient was placed in position over the fluoroscopy table. The target area was identified and the area prepped in the usual manner. Skin & deeper  tissues infiltrated with local anesthetic. Appropriate amount of time allowed to pass for local anesthetics to take effect. The procedure needles were then advanced to the target area. Proper needle placement secured. Negative aspiration confirmed. Solution injected in intermittent fashion, asking for systemic symptoms every 0.5cc of injectate. The needles were then  removed and the area cleansed, making sure to leave some of the prepping solution back to take advantage of its long term bactericidal properties. EBL: Minimal Materials & Medications Used:  Needle(s) Used: 22g - 7" Spinal Needle(s) Solution Injected: 0.2% PF-Ropivacaine (17m) + SDV-DepoMedrol 80 mg/ml (188m Medications Administered today: We administered fentaNYL, lactated ringers, midazolam, methylPREDNISolone acetate, lidocaine (PF), and ropivacaine (PF) 2 mg/ml (0.2%).Please see chart orders for dosing details.  Imaging Guidance:  Type of Imaging Technique: Fluoroscopy Guidance (Non-spinal) Indication(s): Assistance in needle guidance and placement for procedures requiring needle placement in or near specific anatomical locations not easily accessible without such assistance. Exposure Time: Please see nurses notes. Contrast: Before injecting any contrast, we confirmed that the patient did not have an allergy to iodine, shellfish, or radiological contrast. Once satisfactory needle placement was completed at the desired level, radiological contrast was injected. Injection was conducted under continuous fluoroscopic guidance. Injection of contrast accomplished without complications. See chart for type and volume of contrast used. Fluoroscopic Guidance: I was personally present in the fluoroscopy suite, where the patient was placed in position for the procedure, over the fluoroscopy-compatible table. Fluoroscopy was manipulated, using "Tunnel Vision Technique", to obtain the best possible view of the target area, on the affected side.  Parallax error was corrected before commencing the procedure. A "direction-depth-direction" technique was used to introduce the needle under continuous pulsed fluoroscopic guidance. Once the target was reached, antero-posterior, oblique, and lateral fluoroscopic projection views were taken to confirm needle placement in all planes. Permanently recorded images stored by scanning into EMR. Ultrasound Guidance: Not used. Interpretation: Intraoperative imaging interpretation by performing Physician. Adequate needle placement confirmed. Needle placement confirmed in AP, lateral, & Oblique Views. Appropriate spread of contrast to desired area. No evidence of afferent or efferent intravascular uptake. Permanent hardcopy images in multiple planes scanned into the patient's record.  Antibiotics:  Type:  Antibiotics Given (last 72 hours)    None      Indication(s): No indications identified or reported.  Post-operative Assessment:  Complications: No immediate post-treatment complications were observed. Disposition: The patient was discharged home, once institutional criteria were met. Return to clinic in 2 weeks for follow-up evaluation and interpretation of results. The patient tolerated the entire procedure well. A repeat set of vitals were taken after the procedure and the patient was kept under observation following institutional policy, for this type of procedure. Post-procedural neurological assessment was performed, showing return to baseline, prior to discharge. The patient was provided with post-procedure discharge instructions, including a section on how to identify potential problems. Should any problems arise concerning this procedure, the patient was given instructions to immediately contact usKoreaat any time, without hesitation. In any case, we plan to contact the patient by telephone for a follow-up status report regarding this interventional procedure. Comments:  No additional relevant  information.  Primary Care Physician: BAMarcello FennelMD [facility] Note by: FrKathlen BrunswickNaDossie ArbourM.D, DABA, DABAPM, DABPM, DABIPP, FIPP  Disclaimer:  Medicine is not an exact science. The only guarantee in medicine is that nothing is guaranteed. It is important to note that the decision to proceed with this intervention was based on the information collected from the patient. The Data and conclusions were drawn from the patient's questionnaire, the interview, and the physical examination. Because the information was provided in large part by the patient, it cannot be guaranteed that it has not been purposely or unconsciously manipulated. Every effort has been made to obtain  as much relevant data as possible for this evaluation. It is important to note that the conclusions that lead to this procedure are derived in large part from the available data. Always take into account that the treatment will also be dependent on availability of resources and existing treatment guidelines, considered by other Pain Management Practitioners as being common knowledge and practice, at the time of the intervention. For Medico-Legal purposes, it is also important to point out that variation in procedural techniques and pharmacological choices are the acceptable norm. The indications, contraindications, technique, and results of the above procedure should only be interpreted and judged by a Board-Certified Interventional Pain Specialist with extensive familiarity and expertise in the same exact procedure and technique. Attempts at providing opinions without similar or greater experience and expertise than that of the treating physician will be considered as inappropriate and unethical, and shall result in a formal complaint to the state medical board and applicable specialty societies.

## 2015-04-23 ENCOUNTER — Encounter: Payer: Medicare Other | Admitting: Pain Medicine

## 2015-05-15 ENCOUNTER — Other Ambulatory Visit: Payer: Self-pay | Admitting: Physician Assistant

## 2015-05-16 NOTE — Patient Instructions (Addendum)
YOUR PROCEDURE IS SCHEDULED ON : 05/30/15  REPORT TO Al[facility] M[facility] MAIN ENTRANCE FOLLOW SIGNS TO EAST ELEVATOR - GO TO 3rd FLOOR CHECK IN AT 3 EAST NURSES STATION (SHORT STAY) AT: 10:15 AM  CALL THIS NUMBER IF YOU HAVE PROBLEMS THE MORNING OF SURGERY [phone]  REMEMBER:ONLY 1 PER PERSON MAY GO TO SHORT STAY WITH YOU TO GET READY THE MORNING OF YOUR SURGERY  DO NOT EAT FOOD OR DRINK LIQUIDS AFTER MIDNIGHT  TAKE THESE MEDICINES THE MORNING OF SURGERY: TEGRETOL / GABAPENTIN / TYLENOL  MAY HAVE CLEAR LIQUIDS UNTIL 6:00 AM  YOU MAY NOT HAVE ANY METAL ON YOUR BODY INCLUDING HAIR PINS AND PIERCING'S. DO NOT WEAR JEWELRY, MAKEUP, LOTIONS, POWDERS OR PERFUMES. DO NOT WEAR NAIL POLISH. DO NOT SHAVE 48 HRS PRIOR TO SURGERY. MEN MAY SHAVE FACE AND NECK.  DO NOT [place]. [facility] IS NOT RESPONSIBLE FOR VALUABLES.  CONTACTS, DENTURES OR PARTIALS MAY NOT BE WORN TO SURGERY. LEAVE SUITCASE IN CAR. CAN BE BROUGHT TO ROOM AFTER SURGERY.  PATIENTS DISCHARGED THE DAY OF SURGERY WILL NOT BE ALLOWED TO DRIVE HOME.  PLEASE READ OVER THE FOLLOWING INSTRUCTION SHEETS _________________________________________________________________________________                                           - PREPARING FOR SURGERY  Before surgery, you can play an important role.  Because skin is not sterile, your skin needs to be as free of germs as possible.  You can reduce the number of germs on your skin by washing with CHG (chlorahexidine gluconate) soap before surgery.  CHG is an antiseptic cleaner which kills germs and bonds with the skin to continue killing germs even after washing. Please DO NOT use if you have an allergy to CHG or antibacterial soaps.  If your skin becomes reddened/irritated stop using the CHG and inform your nurse when you arrive at Short Stay. Do not shave (including legs and underarms) for at least 48 hours prior to the first CHG shower.  You  may shave your face. Please follow these instructions carefully:   1.  Shower with CHG Soap the night before surgery and the  morning of Surgery.   2.  If you choose to wash your hair, wash your hair first as usual with your  normal  Shampoo.   3.  After you shampoo, rinse your hair and body thoroughly to remove the  shampoo.                                         4.  Use CHG as you would any other liquid soap.  You can apply chg directly  to the skin and wash . Gently wash with scrungie or clean wascloth    5.  Apply the CHG Soap to your body ONLY FROM THE NECK DOWN.   Do not use on open                           Wound or open sores. Avoid contact with eyes, ears mouth and genitals (private parts).  Genitals (private parts) with your normal soap.              6.  Wash thoroughly, paying special attention to the area where your surgery  will be performed.   7.  Thoroughly rinse your body with warm water from the neck down.   8.  DO NOT shower/wash with your normal soap after using and rinsing off  the CHG Soap .                9.  Pat yourself dry with a clean towel.             10.  Wear clean night clothes to bed after shower             11.  Place clean sheets on your bed the night of your first shower and do not  sleep with pets.  Day of Surgery : Do not apply any lotions/deodorants the morning of surgery.  Please wear clean clothes to the hospital/surgery center.  FAILURE TO FOLLOW THESE INSTRUCTIONS MAY RESULT IN THE CANCELLATION OF YOUR SURGERY    PATIENT SIGNATURE_________________________________  ______________________________________________________________________     Alisha Mullen  An incentive spirometer is a tool that can help keep your lungs clear and active. This tool measures how well you are filling your lungs with each breath. Taking long deep breaths may help reverse or decrease the chance of developing breathing  (pulmonary) problems (especially infection) following:  A long period of time when you are unable to move or be active. BEFORE THE PROCEDURE   If the spirometer includes an indicator to show your best effort, your nurse or respiratory therapist will set it to a desired goal.  If possible, sit up straight or lean slightly forward. Try not to slouch.  Hold the incentive spirometer in an upright position. INSTRUCTIONS FOR USE  1. Sit on the edge of your bed if possible, or sit up as far as you can in bed or on a chair. 2. Hold the incentive spirometer in an upright position. 3. Breathe out normally. 4. Place the mouthpiece in your mouth and seal your lips tightly around it. 5. Breathe in slowly and as deeply as possible, raising the piston or the ball toward the top of the column. 6. Hold your breath for 3-5 seconds or for as long as possible. Allow the piston or ball to fall to the bottom of the column. 7. Remove the mouthpiece from your mouth and breathe out normally. 8. Rest for a few seconds and repeat Steps 1 through 7 at least 10 times every 1-2 hours when you are awake. Take your time and take a few normal breaths between deep breaths. 9. The spirometer may include an indicator to show your best effort. Use the indicator as a goal to work toward during each repetition. 10. After each set of 10 deep breaths, practice coughing to be sure your lungs are clear. If you have an incision (the cut made at the time of surgery), support your incision when coughing by placing a pillow or rolled up towels firmly against it. Once you are able to get out of bed, walk around indoors and cough well. You may stop using the incentive spirometer when instructed by your caregiver.  RISKS AND COMPLICATIONS  Take your time so you do not get dizzy or light-headed.  If you are in pain, you may need to take or ask for pain medication before doing incentive spirometry. It is  harder to take a deep breath if you  are having pain. AFTER USE  Rest and breathe slowly and easily.  It can be helpful to keep track of a log of your progress. Your caregiver can provide you with a simple table to help with this. If you are using the spirometer at home, follow these instructions: [place] IF:   You are having difficultly using the spirometer.  You have trouble using the spirometer as often as instructed.  Your pain medication is not giving enough relief while using the spirometer.  You develop fever of 100.5 F (38.1 C) or higher. SEEK IMMEDIATE MEDICAL CARE IF:   You cough up bloody sputum that had not been present before.  You develop fever of 102 F (38.9 C) or greater.  You develop worsening pain at or near the incision site. MAKE SURE YOU:   Understand these instructions.  Will watch your condition.  Will get help right away if you are not doing well or get worse. Document Released: 07/05/2006 Document Revised: 05/17/2011 Document Reviewed: 09/05/2006 ExitCare Patient Information 2014 [place].   ________________________________________________________________________    CLEAR LIQUID DIET   Foods Allowed                                                                     Foods Excluded  Coffee and tea, regular and decaf                             liquids that you cannot  Plain Jell-O in any flavor                                             see through such as: Fruit ices (not with fruit pulp)                                     milk, soups, orange juice  Iced Popsicles                                                            All solid food Carbonated beverages, regular and diet                                    Cranberry, grape and apple juices Sports drinks like Gatorade Lightly seasoned clear broth or consume(fat free) Sugar, honey syrup _____________________________________________________________________

## 2015-05-20 ENCOUNTER — Encounter (HOSPITAL_COMMUNITY): Payer: Self-pay

## 2015-05-20 ENCOUNTER — Encounter (HOSPITAL_COMMUNITY)
Admission: RE | Admit: 2015-05-20 | Discharge: 2015-05-20 | Disposition: A | Discharge status: Home / Self Care | Payer: Medicare Other | Source: Ambulatory Visit | Attending: Orthopaedic Surgery | Admitting: Orthopaedic Surgery

## 2015-05-20 DIAGNOSIS — Z01812 Encounter for preprocedural laboratory examination: Secondary | ICD-10-CM | POA: Diagnosis not present

## 2015-05-20 DIAGNOSIS — Z0183 Encounter for blood typing: Secondary | ICD-10-CM | POA: Insufficient documentation

## 2015-05-20 DIAGNOSIS — M1612 Unilateral primary osteoarthritis, left hip: Secondary | ICD-10-CM | POA: Insufficient documentation

## 2015-05-20 HISTORY — DX: Diverticulosis of intestine, part unspecified, without perforation or abscess without bleeding: K57.90

## 2015-05-20 HISTORY — DX: Spontaneous ecchymoses: R23.3

## 2015-05-20 HISTORY — DX: Other skin changes: R23.8

## 2015-05-20 LAB — PROTIME-INR
INR: 1.04 (ref 0.00–1.49)
Prothrombin Time: 13.4 seconds (ref 11.6–15.2)

## 2015-05-20 LAB — SURGICAL PCR SCREEN
MRSA, PCR: NEGATIVE
STAPHYLOCOCCUS AUREUS: NEGATIVE

## 2015-05-20 LAB — ABO/RH: ABO/RH(D): A POS

## 2015-05-28 NOTE — Progress Notes (Signed)
Pt notified of surgery time change to 11:30 am - instructed to arrive at 9:30 am - NPO after midnight

## 2015-05-30 ENCOUNTER — Inpatient Hospital Stay (HOSPITAL_COMMUNITY): Payer: Medicare Other | Admitting: Registered Nurse

## 2015-05-30 ENCOUNTER — Inpatient Hospital Stay (HOSPITAL_COMMUNITY): Payer: Medicare Other

## 2015-05-30 ENCOUNTER — Inpatient Hospital Stay (HOSPITAL_COMMUNITY)
Admission: RE | Admit: 2015-05-30 | Discharge: 2015-06-02 | DRG: 470 | Disposition: A | Discharge status: Home Health | Payer: Medicare Other | Source: Ambulatory Visit | Attending: Orthopaedic Surgery | Admitting: Orthopaedic Surgery

## 2015-05-30 ENCOUNTER — Encounter (HOSPITAL_COMMUNITY): Payer: Self-pay | Admitting: *Deleted

## 2015-05-30 ENCOUNTER — Encounter (HOSPITAL_COMMUNITY)
Admission: RE | Disposition: A | Discharge status: Home Health | Payer: Self-pay | Source: Ambulatory Visit | Attending: Orthopaedic Surgery

## 2015-05-30 DIAGNOSIS — E669 Obesity, unspecified: Secondary | ICD-10-CM | POA: Diagnosis present

## 2015-05-30 DIAGNOSIS — Z79899 Other long term (current) drug therapy: Secondary | ICD-10-CM

## 2015-05-30 DIAGNOSIS — M1612 Unilateral primary osteoarthritis, left hip: Secondary | ICD-10-CM | POA: Diagnosis present

## 2015-05-30 DIAGNOSIS — Z6836 Body mass index (BMI) 36.0-36.9, adult: Secondary | ICD-10-CM | POA: Diagnosis not present

## 2015-05-30 DIAGNOSIS — I739 Peripheral vascular disease, unspecified: Secondary | ICD-10-CM | POA: Diagnosis present

## 2015-05-30 DIAGNOSIS — Z96612 Presence of left artificial shoulder joint: Secondary | ICD-10-CM | POA: Diagnosis present

## 2015-05-30 DIAGNOSIS — Z419 Encounter for procedure for purposes other than remedying health state, unspecified: Secondary | ICD-10-CM

## 2015-05-30 DIAGNOSIS — M4806 Spinal stenosis, lumbar region: Secondary | ICD-10-CM | POA: Diagnosis present

## 2015-05-30 DIAGNOSIS — Z6835 Body mass index (BMI) 35.0-35.9, adult: Secondary | ICD-10-CM | POA: Diagnosis not present

## 2015-05-30 DIAGNOSIS — D62 Acute posthemorrhagic anemia: Secondary | ICD-10-CM | POA: Diagnosis not present

## 2015-05-30 DIAGNOSIS — Z96642 Presence of left artificial hip joint: Secondary | ICD-10-CM

## 2015-05-30 DIAGNOSIS — N189 Chronic kidney disease, unspecified: Secondary | ICD-10-CM | POA: Diagnosis present

## 2015-05-30 DIAGNOSIS — Z01812 Encounter for preprocedural laboratory examination: Secondary | ICD-10-CM | POA: Diagnosis not present

## 2015-05-30 DIAGNOSIS — M25552 Pain in left hip: Secondary | ICD-10-CM | POA: Diagnosis present

## 2015-05-30 HISTORY — PX: TOTAL HIP ARTHROPLASTY: SHX124

## 2015-05-30 LAB — TYPE AND SCREEN
ABO/RH(D): A POS
Antibody Screen: NEGATIVE

## 2015-05-30 SURGERY — ARTHROPLASTY, HIP, TOTAL, ANTERIOR APPROACH
Anesthesia: General | Site: Hip | Laterality: Left

## 2015-05-30 MED ORDER — CLINDAMYCIN PHOSPHATE 900 MG/50ML IV SOLN
INTRAVENOUS | Status: AC
  Filled 2015-05-30: qty 50

## 2015-05-30 MED ORDER — METOCLOPRAMIDE HCL 5 MG/ML IJ SOLN: 5.0000 mg | Freq: Three times a day (TID) | INTRAMUSCULAR | Status: DC | PRN

## 2015-05-30 MED ORDER — FENTANYL CITRATE (PF) 100 MCG/2ML IJ SOLN
INTRAMUSCULAR | Status: DC | PRN
  Administered 2015-05-30 (×4): 50 ug via INTRAVENOUS

## 2015-05-30 MED ORDER — HYDROMORPHONE HCL 1 MG/ML IJ SOLN
INTRAMUSCULAR | Status: AC
  Filled 2015-05-30: qty 1

## 2015-05-30 MED ORDER — SIMVASTATIN 20 MG PO TABS
20.0000 mg | ORAL_TABLET | Freq: Every day | ORAL | Status: DC
  Administered 2015-05-30 – 2015-06-01 (×3): 20 mg via ORAL
  Filled 2015-05-30 (×4): qty 1

## 2015-05-30 MED ORDER — SUCCINYLCHOLINE CHLORIDE 20 MG/ML IJ SOLN
INTRAMUSCULAR | Status: DC | PRN
  Administered 2015-05-30: 100 mg via INTRAVENOUS

## 2015-05-30 MED ORDER — HYDROMORPHONE HCL 2 MG/ML IJ SOLN
INTRAMUSCULAR | Status: AC
  Filled 2015-05-30: qty 1

## 2015-05-30 MED ORDER — PHENOL 1.4 % MT LIQD
1.0000 | OROMUCOSAL | Status: DC | PRN
Start: 2015-05-30 — End: 2015-06-02

## 2015-05-30 MED ORDER — EPHEDRINE SULFATE 50 MG/ML IJ SOLN
INTRAMUSCULAR | Status: DC | PRN
Start: 2015-05-30 — End: 2015-05-30
  Administered 2015-05-30: 5 mg via INTRAVENOUS

## 2015-05-30 MED ORDER — SUGAMMADEX SODIUM 200 MG/2ML IV SOLN
INTRAVENOUS | Status: DC | PRN
  Administered 2015-05-30: 200 mg via INTRAVENOUS

## 2015-05-30 MED ORDER — DEXAMETHASONE SODIUM PHOSPHATE 10 MG/ML IJ SOLN
INTRAMUSCULAR | Status: AC
  Filled 2015-05-30: qty 1

## 2015-05-30 MED ORDER — MIDAZOLAM HCL 5 MG/5ML IJ SOLN
INTRAMUSCULAR | Status: DC | PRN
  Administered 2015-05-30: 1 mg via INTRAVENOUS

## 2015-05-30 MED ORDER — DEXAMETHASONE SODIUM PHOSPHATE 10 MG/ML IJ SOLN
INTRAMUSCULAR | Status: DC | PRN
  Administered 2015-05-30: 10 mg via INTRAVENOUS

## 2015-05-30 MED ORDER — CHLORHEXIDINE GLUCONATE 4 % EX LIQD: 60.0000 mL | Freq: Once | CUTANEOUS | Status: DC

## 2015-05-30 MED ORDER — OXYCODONE HCL 5 MG PO TABS
5.0000 mg | ORAL_TABLET | ORAL | Status: DC | PRN
  Administered 2015-05-30 – 2015-06-02 (×15): 10 mg via ORAL
  Filled 2015-05-30 (×15): qty 2

## 2015-05-30 MED ORDER — SODIUM CHLORIDE 0.9 % IV SOLN
INTRAVENOUS | Status: DC
  Administered 2015-05-30: 15:00:00 via INTRAVENOUS

## 2015-05-30 MED ORDER — LACTATED RINGERS IV SOLN
INTRAVENOUS | Status: DC | PRN
Start: 2015-05-30 — End: 2015-05-30
  Administered 2015-05-30: 10:00:00 via INTRAVENOUS

## 2015-05-30 MED ORDER — LACTATED RINGERS IV SOLN: INTRAVENOUS | Status: DC

## 2015-05-30 MED ORDER — FENTANYL CITRATE (PF) 100 MCG/2ML IJ SOLN
INTRAMUSCULAR | Status: AC
  Filled 2015-05-30: qty 2

## 2015-05-30 MED ORDER — ACETAMINOPHEN 650 MG RE SUPP: 650.0000 mg | Freq: Four times a day (QID) | RECTAL | Status: DC | PRN

## 2015-05-30 MED ORDER — CLINDAMYCIN PHOSPHATE 600 MG/50ML IV SOLN
600.0000 mg | Freq: Four times a day (QID) | INTRAVENOUS | Status: AC
  Administered 2015-05-30 – 2015-05-31 (×2): 600 mg via INTRAVENOUS
  Filled 2015-05-30 (×2): qty 50

## 2015-05-30 MED ORDER — ONDANSETRON HCL 4 MG/2ML IJ SOLN: 4.0000 mg | Freq: Four times a day (QID) | INTRAMUSCULAR | Status: DC | PRN

## 2015-05-30 MED ORDER — HYDROMORPHONE HCL 1 MG/ML IJ SOLN
1.0000 mg | INTRAMUSCULAR | Status: DC | PRN
Start: 2015-05-30 — End: 2015-06-02

## 2015-05-30 MED ORDER — PROPOFOL 10 MG/ML IV BOLUS
INTRAVENOUS | Status: DC | PRN
  Administered 2015-05-30: 100 mg via INTRAVENOUS

## 2015-05-30 MED ORDER — LIDOCAINE HCL (CARDIAC) 20 MG/ML IV SOLN
INTRAVENOUS | Status: AC
  Filled 2015-05-30: qty 5

## 2015-05-30 MED ORDER — ROCURONIUM BROMIDE 100 MG/10ML IV SOLN
INTRAVENOUS | Status: DC | PRN
  Administered 2015-05-30: 20 mg via INTRAVENOUS
  Administered 2015-05-30: 10 mg via INTRAVENOUS

## 2015-05-30 MED ORDER — TRANEXAMIC ACID 1000 MG/10ML IV SOLN
1000.0000 mg | INTRAVENOUS | Status: AC
  Filled 2015-05-30: qty 10

## 2015-05-30 MED ORDER — SODIUM CHLORIDE 0.9 % IR SOLN
Status: DC | PRN
  Administered 2015-05-30: 1000 mL

## 2015-05-30 MED ORDER — ACETAMINOPHEN 325 MG PO TABS: 650.0000 mg | ORAL_TABLET | Freq: Four times a day (QID) | ORAL | Status: DC | PRN

## 2015-05-30 MED ORDER — HYDROMORPHONE HCL 1 MG/ML IJ SOLN
INTRAMUSCULAR | Status: DC | PRN
  Administered 2015-05-30 (×2): .4 mg via INTRAVENOUS
  Administered 2015-05-30: .6 mg via INTRAVENOUS

## 2015-05-30 MED ORDER — HYDROMORPHONE HCL 1 MG/ML IJ SOLN
0.2500 mg | INTRAMUSCULAR | Status: DC | PRN
  Administered 2015-05-30 (×2): 0.5 mg via INTRAVENOUS

## 2015-05-30 MED ORDER — METHOCARBAMOL 500 MG PO TABS
500.0000 mg | ORAL_TABLET | Freq: Four times a day (QID) | ORAL | Status: DC | PRN
  Administered 2015-05-30 – 2015-06-02 (×8): 500 mg via ORAL
  Filled 2015-05-30 (×8): qty 1

## 2015-05-30 MED ORDER — METHOCARBAMOL 1000 MG/10ML IJ SOLN
500.0000 mg | Freq: Four times a day (QID) | INTRAVENOUS | Status: DC | PRN
  Administered 2015-05-30: 500 mg via INTRAVENOUS
  Filled 2015-05-30 (×2): qty 5

## 2015-05-30 MED ORDER — ROCURONIUM BROMIDE 100 MG/10ML IV SOLN
INTRAVENOUS | Status: AC
  Filled 2015-05-30: qty 1

## 2015-05-30 MED ORDER — ONDANSETRON HCL 4 MG/2ML IJ SOLN
INTRAMUSCULAR | Status: DC | PRN
  Administered 2015-05-30: 4 mg via INTRAVENOUS

## 2015-05-30 MED ORDER — ZOLPIDEM TARTRATE 5 MG PO TABS: 5.0000 mg | ORAL_TABLET | Freq: Every evening | ORAL | Status: DC | PRN

## 2015-05-30 MED ORDER — LIDOCAINE HCL (CARDIAC) 20 MG/ML IV SOLN
INTRAVENOUS | Status: DC | PRN
Start: 2015-05-30 — End: 2015-05-30
  Administered 2015-05-30: 100 mg via INTRAVENOUS

## 2015-05-30 MED ORDER — CARBAMAZEPINE 200 MG PO TABS
200.0000 mg | ORAL_TABLET | Freq: Two times a day (BID) | ORAL | Status: DC
  Administered 2015-05-30 – 2015-06-02 (×6): 200 mg via ORAL
  Filled 2015-05-30 (×7): qty 1

## 2015-05-30 MED ORDER — VITAMIN B-12 1000 MCG PO TABS
1000.0000 ug | ORAL_TABLET | Freq: Every day | ORAL | Status: DC
  Administered 2015-05-30 – 2015-06-01 (×3): 1000 ug via ORAL
  Filled 2015-05-30 (×4): qty 1

## 2015-05-30 MED ORDER — ONDANSETRON HCL 4 MG PO TABS: 4.0000 mg | ORAL_TABLET | Freq: Four times a day (QID) | ORAL | Status: DC | PRN

## 2015-05-30 MED ORDER — ONDANSETRON HCL 4 MG/2ML IJ SOLN: 4.0000 mg | Freq: Once | INTRAMUSCULAR | Status: DC | PRN

## 2015-05-30 MED ORDER — VITAMIN C 500 MG PO TABS
500.0000 mg | ORAL_TABLET | Freq: Every day | ORAL | Status: DC
  Administered 2015-05-30 – 2015-06-01 (×3): 500 mg via ORAL
  Filled 2015-05-30 (×4): qty 1

## 2015-05-30 MED ORDER — PROPOFOL 10 MG/ML IV BOLUS
INTRAVENOUS | Status: AC
  Filled 2015-05-30: qty 40

## 2015-05-30 MED ORDER — DOCUSATE SODIUM 100 MG PO CAPS
100.0000 mg | ORAL_CAPSULE | Freq: Two times a day (BID) | ORAL | Status: DC
  Administered 2015-05-30 – 2015-06-02 (×6): 100 mg via ORAL

## 2015-05-30 MED ORDER — ONDANSETRON HCL 4 MG/2ML IJ SOLN
INTRAMUSCULAR | Status: AC
  Filled 2015-05-30: qty 2

## 2015-05-30 MED ORDER — GABAPENTIN 300 MG PO CAPS
300.0000 mg | ORAL_CAPSULE | Freq: Two times a day (BID) | ORAL | Status: DC
  Administered 2015-05-30 – 2015-06-02 (×6): 300 mg via ORAL
  Filled 2015-05-30 (×7): qty 1

## 2015-05-30 MED ORDER — ALUM & MAG HYDROXIDE-SIMETH 200-200-20 MG/5ML PO SUSP: 30.0000 mL | ORAL | Status: DC | PRN

## 2015-05-30 MED ORDER — METOCLOPRAMIDE HCL 10 MG PO TABS: 5.0000 mg | ORAL_TABLET | Freq: Three times a day (TID) | ORAL | Status: DC | PRN

## 2015-05-30 MED ORDER — DIPHENHYDRAMINE HCL 12.5 MG/5ML PO ELIX: 12.5000 mg | ORAL_SOLUTION | ORAL | Status: DC | PRN

## 2015-05-30 MED ORDER — ASPIRIN EC 325 MG PO TBEC
325.0000 mg | DELAYED_RELEASE_TABLET | Freq: Two times a day (BID) | ORAL | Status: DC
  Administered 2015-05-31 – 2015-06-02 (×5): 325 mg via ORAL
  Filled 2015-05-30 (×7): qty 1

## 2015-05-30 MED ORDER — VITAMIN D 1000 UNITS PO TABS
1000.0000 [IU] | ORAL_TABLET | Freq: Every day | ORAL | Status: DC
  Administered 2015-05-31 – 2015-06-02 (×3): 1000 [IU] via ORAL
  Filled 2015-05-30 (×4): qty 1

## 2015-05-30 MED ORDER — FENTANYL CITRATE (PF) 100 MCG/2ML IJ SOLN
INTRAMUSCULAR | Status: AC
Start: 2015-05-30 — End: 2015-05-30
  Filled 2015-05-30: qty 2

## 2015-05-30 MED ORDER — MIDAZOLAM HCL 2 MG/2ML IJ SOLN
INTRAMUSCULAR | Status: AC
  Filled 2015-05-30: qty 2

## 2015-05-30 MED ORDER — SUGAMMADEX SODIUM 200 MG/2ML IV SOLN
INTRAVENOUS | Status: AC
  Filled 2015-05-30: qty 2

## 2015-05-30 MED ORDER — CLINDAMYCIN PHOSPHATE 900 MG/50ML IV SOLN
900.0000 mg | INTRAVENOUS | Status: AC
  Administered 2015-05-30: 900 mg via INTRAVENOUS

## 2015-05-30 MED ORDER — MENTHOL 3 MG MT LOZG: 1.0000 | LOZENGE | OROMUCOSAL | Status: DC | PRN

## 2015-05-30 SURGICAL SUPPLY — 36 items
APL SKNCLS STERI-STRIP NONHPOA (GAUZE/BANDAGES/DRESSINGS)
BAG SPEC THK2 15X12 ZIP CLS (MISCELLANEOUS)
BAG ZIPLOCK 12X15 (MISCELLANEOUS) IMPLANT
BENZOIN TINCTURE PRP APPL 2/3 (GAUZE/BANDAGES/DRESSINGS) IMPLANT
BLADE SAW SGTL 18X1.27X75 (BLADE) ×2 IMPLANT
CAPT HIP TOTAL 2 ×1 IMPLANT
CELLS DAT CNTRL 66122 CELL SVR (MISCELLANEOUS) ×1 IMPLANT
CLOTH BEACON ORANGE TIMEOUT ST (SAFETY) ×2 IMPLANT
DRAPE STERI IOBAN 125X83 (DRAPES) ×2 IMPLANT
DRAPE U-SHAPE 47X51 STRL (DRAPES) ×4 IMPLANT
DRSG AQUACEL AG ADV 3.5X10 (GAUZE/BANDAGES/DRESSINGS) ×2 IMPLANT
DURAPREP 26ML APPLICATOR (WOUND CARE) ×2 IMPLANT
ELECT REM PT RETURN 9FT ADLT (ELECTROSURGICAL) ×2
ELECTRODE REM PT RTRN 9FT ADLT (ELECTROSURGICAL) ×1 IMPLANT
GAUZE XEROFORM 1X8 LF (GAUZE/BANDAGES/DRESSINGS) ×1 IMPLANT
GLOVE BIO SURGEON STRL SZ7.5 (GLOVE) ×2 IMPLANT
GLOVE BIOGEL PI IND STRL 8 (GLOVE) ×2 IMPLANT
GLOVE BIOGEL PI INDICATOR 8 (GLOVE) ×2
GLOVE ECLIPSE 8.0 STRL XLNG CF (GLOVE) ×2 IMPLANT
GOWN STRL REUS W/TWL XL LVL3 (GOWN DISPOSABLE) ×4 IMPLANT
HANDPIECE INTERPULSE COAX TIP (DISPOSABLE) ×2
HOLDER FOLEY CATH W/STRAP (MISCELLANEOUS) ×2 IMPLANT
PACK ANTERIOR HIP CUSTOM (KITS) ×2 IMPLANT
RETRACTOR WND ALEXIS 18 MED (MISCELLANEOUS) ×1 IMPLANT
RTRCTR WOUND ALEXIS 18CM MED (MISCELLANEOUS) ×2
SET HNDPC FAN SPRY TIP SCT (DISPOSABLE) ×1 IMPLANT
STAPLER VISISTAT 35W (STAPLE) ×1 IMPLANT
STRIP CLOSURE SKIN 1/2X4 (GAUZE/BANDAGES/DRESSINGS) IMPLANT
SUT ETHIBOND NAB CT1 #1 30IN (SUTURE) ×2 IMPLANT
SUT MNCRL AB 4-0 PS2 18 (SUTURE) IMPLANT
SUT VIC AB 0 CT1 36 (SUTURE) ×2 IMPLANT
SUT VIC AB 1 CT1 36 (SUTURE) ×2 IMPLANT
SUT VIC AB 2-0 CT1 27 (SUTURE) ×4
SUT VIC AB 2-0 CT1 TAPERPNT 27 (SUTURE) ×2 IMPLANT
TRAY FOLEY W/METER SILVER 14FR (SET/KITS/TRAYS/PACK) ×2 IMPLANT
TRAY FOLEY W/METER SILVER 16FR (SET/KITS/TRAYS/PACK) ×1 IMPLANT

## 2015-05-30 NOTE — Evaluation (Signed)
Physical Therapy Evaluation Patient Details Name: Alisha Mullen MRN: [id number] DOB: 1934-10-22 Today's Date: 05/30/2015   History of Present Illness  L THR; hx of L TKR, L TSR, and back surg  Clinical Impression  Pt s/p L THR presents with decreased L LE strength/ROM and post op pain limiting functional mobility.  Pt should progress to dc home with dtr and follow up HHPT.    Follow Up Recommendations Home health PT    Equipment Recommendations  None recommended by PT    Recommendations for Other Services OT consult     Precautions / Restrictions Precautions Precautions: Fall Restrictions Weight Bearing Restrictions: No Other Position/Activity Restrictions: WBAT      Mobility  Bed Mobility Overal bed mobility: Needs Assistance Bed Mobility: Supine to Sit     Supine to sit: Min assist     General bed mobility comments: cues for sequence and use of R LE to self assist  Transfers Overall transfer level: Needs assistance Equipment used: Rolling walker (2 wheeled) Transfers: Sit to/from Stand Sit to Stand: Min assist;Mod assist         General transfer comment: cues for LE management and use of UEs to self assist  Ambulation/Gait Ambulation/Gait assistance: Min assist Ambulation Distance (Feet): 14 Feet Assistive device: Rolling walker (2 wheeled) Gait Pattern/deviations: Step-to pattern;Decreased step length - right;Decreased step length - left;Shuffle;Trunk flexed Gait velocity: decr Gait velocity interpretation: Below normal speed for age/gender General Gait Details: cues for sequence, posture and position from [employer]            Wheelchair Mobility    Modified Rankin (Stroke Patients Only)       Balance                                             Pertinent Vitals/Pain Pain Assessment: 0-10 Pain Score: 2  Pain Location: L hip/thigh Pain Descriptors / Indicators: Aching;Burning Pain Intervention(s): Limited activity  within patient's tolerance;Monitored during session;Premedicated before session;Ice applied    Home Living Family/patient expects to be discharged to:: Private residence Living Arrangements: Alone Available Help at Discharge: Family Type of Home: House Home Access: Stairs to enter Entrance Stairs-Rails: Right;Left;Can reach both Technical brewer of Steps: 5   Home Equipment: Walker - 2 wheels Additional Comments: Pt can stay at dtrs house    Prior Function Level of Independence: Independent;Independent with assistive device(s)               Hand Dominance        Extremity/Trunk Assessment   Upper Extremity Assessment: Overall WFL for tasks assessed           Lower Extremity Assessment: LLE deficits/detail      Cervical / Trunk Assessment: Normal  Communication   Communication: No difficulties  Cognition Arousal/Alertness: Awake/alert Behavior During Therapy: WFL for tasks assessed/performed Overall Cognitive Status: Within Functional Limits for tasks assessed                      General Comments      Exercises        Assessment/Plan    PT Assessment Patient needs continued PT services  PT Diagnosis Difficulty walking   PT Problem List Decreased strength;Decreased range of motion;Decreased activity tolerance;Decreased mobility;Decreased knowledge of use of DME;Obesity;Pain  PT Treatment Interventions DME instruction;Gait training;Stair training;Functional mobility training;Therapeutic activities;Therapeutic  exercise;Patient/family education   PT Goals (Current goals can be found in the Care Plan section) Acute Rehab PT Goals Patient Stated Goal: Regain IND PT Goal Formulation: With patient Time For Goal Achievement: 06/04/15 Potential to Achieve Goals: Good    Frequency 7X/week   Barriers to discharge        Co-evaluation               End of Session Equipment Utilized During Treatment: Gait belt Activity Tolerance:  Patient tolerated treatment well Patient left: in chair;with call bell/phone within reach;with family/visitor present Nurse Communication: Mobility status         Time: [id number] PT Time Calculation (min) (ACUTE ONLY): 33 min   Charges:   PT Evaluation $PT Eval Low Complexity: 1 Procedure PT Treatments $Gait Training: 8-22 mins   PT G Codes:        Starlit Raburn Jun 17, 2015, 6:04 PM

## 2015-05-30 NOTE — H&P (Signed)
TOTAL HIP ADMISSION H&P  Patient is admitted for left total hip arthroplasty.  Subjective:  Chief Complaint: left hip pain  HPI: Alisha Mullen, 81 y.o. female, has a history of pain and functional disability in the left hip(s) due to arthritis and patient has failed non-surgical conservative treatments for greater than 12 weeks to include NSAID's and/or analgesics, corticosteriod injections, flexibility and strengthening excercises, use of assistive devices, weight reduction as appropriate and activity modification.  Onset of symptoms was gradual starting 5 years ago with rapidlly worsening course since that time.The patient noted no past surgery on the left hip(s).  Patient currently rates pain in the left hip at 10 out of 10 with activity. Patient has night pain, worsening of pain with activity and weight bearing, trendelenberg gait, pain that interfers with activities of daily living, pain with passive range of motion and crepitus. Patient has evidence of subchondral cysts, subchondral sclerosis, periarticular osteophytes and joint space narrowing by imaging studies. This condition presents safety issues increasing the risk of falls.  There is no current active infection.  Patient Active Problem List   Diagnosis Date Noted  . Grade 1 Anterolisthesis of L5 over S1 with fusion 02/11/2015  . Lumbar foraminal stenosis (Moderate Left L1-2; Severe Left L2-3) 02/11/2015  . Gluteal muscle Tendinosis 01/23/2015  . Osteoarthritis of hip (Left) 01/23/2015  . Obesity, Class II, BMI 35-39.9 01/23/2015  . Lumbar spondylosis 01/23/2015  . Osteoarthrosis 01/23/2015  . Lumbar spinal stenosis (Mild L2-3, L3-4, L4-5) 01/23/2015  . Lumbosacral foraminal stenosis (Left L1-2, L2-3) 01/23/2015  . Lumbar facet hypertrophy (Bilateral L3-4 and L4-5) 01/23/2015  . Lumbar postlaminectomy syndrome (L4 laminectomy and L4-5 PLIF) 01/23/2015  . Long term current use of opiate analgesic 01/13/2015  . Long term  prescription opiate use 01/13/2015  . Opiate use 01/13/2015  . Opiate dependence ([place]) 01/13/2015  . Encounter for therapeutic drug level monitoring 01/13/2015  . 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 Chronic low back pain 01/13/2015  . Chronic lower extremity pain (Left) 01/13/2015  . Chronic lumbar radicular pain (Left) 01/13/2015  . Myofascial pain syndrome (buttocks area) (Left) 01/09/2015  . Musculoskeletal pain 01/09/2015  . Chronic pain 01/01/2015  . Chronic hip pain (Left) 01/01/2015  . Other long term (current) drug therapy 01/01/2015  . History of lumbosacral spine surgery 01/01/2015  . Epilepsy ([place]) 06/11/2011  . Hypercholesterolemia 06/11/2011  . BP (high blood pressure) 06/11/2011  . Adiposity 06/11/2011  . Osteoporosis, post-menopausal 06/11/2011  . Leg varices 06/11/2011   Past Medical History  Diagnosis Date  . Arthritis   . Cancer (HCC)     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  . Seizures ([place])     last seizure 10 yrs ago  "controlled epilepsy"  . Bruises easily   . Diverticulosis   . Chronic kidney disease     decreased rt kidney function - followed by [place]    Past Surgical History  Procedure Laterality Date  . Appendectomy    . Tonsillectomy    . Carpal tunnel release    . Foot surgery Right 2005    both right and left feet  . Shoulder surgery Right   . Total shoulder replacement Left 2007  . Replacement total knee Left 2001  . Cataract extraction bilateral w/ anterior vitrectomy Bilateral   . Back surgery  2011  . Abdominal hysterectomy    . Breast excisional biopsy Right     surgical bx neg  .  Breast excisional biopsy Right     surgical neg  . Breast surgery  1970 / 1980    benign lumpectomy  . Cholecystectomy    . Joint replacement      total L shoulder / total L knee    No prescriptions prior to admission   Allergies  Allergen Reactions  . Demerol [Meperidine] Other (See Comments)    seizures   . Metoprolol Other (See Comments)    Shortness of breath and swelling  . Codeine Other (See Comments)    Chest pain  . Lotrel [Amlodipine Besy-Benazepril Hcl] Other (See Comments)    Swelling and numbness of mouth  . Norvasc [Amlodipine Besylate] Swelling  . Penicillins Swelling    .Marland KitchenHas patient had a PCN reaction causing immediate rash, facial/tongue/throat swelling, SOB or lightheadedness with hypotension: Yes _ SWELLING AROUND MOUTH If all of the above answers are "NO", then may proceed with Cephalosporin use.   . Sulfa Antibiotics Rash  . Amlodipine   . Vioxx [Rofecoxib] Other (See Comments)    Muscle aches  . Hyzaar [Losartan Potassium-Hctz] Other (See Comments)    Muscle aches  . Lipitor [Atorvastatin] Other (See Comments)    Muscle aches  . Loratadine Other (See Comments)    depression    Social History  Substance Use Topics  . Smoking status: Never Smoker   . Smokeless tobacco: Not on file  . Alcohol Use: No    Family History  Problem Relation Age of Onset  . Cancer Mother   . Hypertension Mother   . Heart disease Father      Review of Systems  Musculoskeletal: Positive for myalgias, back pain and joint pain.  All other systems reviewed and are negative.   Objective:  Physical Exam  Constitutional: She is oriented to person, place, and time. She appears well-developed and well-nourished.  HENT:  Head: Normocephalic and atraumatic.  Eyes: EOM are normal. Pupils are equal, round, and reactive to light.  Neck: Normal range of motion. Neck supple.  Cardiovascular: Normal rate and regular rhythm.   Respiratory: Effort normal and breath sounds normal.  GI: Soft. Bowel sounds are normal.  Musculoskeletal:       Left hip: She exhibits decreased range of motion, decreased strength, tenderness, bony tenderness and crepitus.  Neurological: She is alert and oriented to person, place, and time.  Skin: Skin is warm and dry.  Psychiatric: She has a normal mood and  affect.    Vital signs in last 24 hours:    Labs:   Estimated body mass index is 35.00 kg/(m^2) as calculated from the following:   Height as of 04/01/15: 5\' 4"  (1.626 m).   Weight as of 04/01/15: 92.534 kg (204 lb).   Imaging Review Plain radiographs demonstrate severe degenerative joint disease of the left hip(s). The bone quality appears to be good for age and reported activity level.  Assessment/Plan:  End stage arthritis, left hip(s)  The patient history, physical examination, clinical judgement of the provider and imaging studies are consistent with end stage degenerative joint disease of the left hip(s) and total hip arthroplasty is deemed medically necessary. The treatment options including medical management, injection therapy, arthroscopy and arthroplasty were discussed at length. The risks and benefits of total hip arthroplasty were presented and reviewed. The risks due to aseptic loosening, infection, stiffness, dislocation/subluxation,  thromboembolic complications and other imponderables were discussed.  The patient acknowledged the explanation, agreed to proceed with the plan and consent was signed. Patient is  being admitted for inpatient treatment for surgery, pain control, PT, OT, prophylactic antibiotics, VTE prophylaxis, progressive ambulation and ADL's and discharge planning.The patient is planning to be discharged to skilled nursing facility

## 2015-05-30 NOTE — Progress Notes (Signed)
Portable AP Pelvis and Lateral Left Hip X-rays done. 

## 2015-05-30 NOTE — Progress Notes (Signed)
Utilization review completed.  L. J. Jamai Dolce RN, BSN, CM 

## 2015-05-30 NOTE — Brief Op Note (Signed)
05/30/2015  12:07 PM  PATIENT:  Alisha Mullen  81 y.o. female  PRE-OPERATIVE DIAGNOSIS:  Severe osteoarthritis left hip  POST-OPERATIVE DIAGNOSIS:  Severe osteoarthritis left hip  PROCEDURE:  Procedure(s): LEFT TOTAL HIP ARTHROPLASTY ANTERIOR APPROACH (Left)  SURGEON:  Surgeon(s) and Role:    * Mcarthur Rossetti, MD - Primary  PHYSICIAN ASSISTANT: Benita Stabile, PA-C  ANESTHESIA:   general  EBL:  Total I/O In: 1000 [I.V.:1000] Out: 425 [Urine:300; Blood:125]  COUNTS:  YES  TOURNIQUET:  * No tourniquets in log *  DICTATION: .Other Dictation: Dictation Number [id number]  PLAN OF CARE: Admit to inpatient   PATIENT DISPOSITION:  PACU - hemodynamically stable.   Delay start of Pharmacological VTE agent (>24hrs) due to surgical blood loss or risk of bleeding: no

## 2015-05-30 NOTE — Transfer of Care (Signed)
Immediate Anesthesia Transfer of Care Note  Patient: Alisha Mullen  Procedure(s) Performed: Procedure(s): LEFT TOTAL HIP ARTHROPLASTY ANTERIOR APPROACH (Left)  Patient Location: PACU  Anesthesia Type:General  Level of Consciousness: awake, alert , oriented and patient cooperative  Airway & Oxygen Therapy: Patient Spontanous Breathing and Patient connected to face mask oxygen  Post-op Assessment: Report given to RN, Post -op Vital signs reviewed and stable and Patient moving all extremities  Post vital signs: Reviewed and stable  Last Vitals:  Filed Vitals:   05/30/15 0926  BP: 154/67  Pulse: 66  Temp: 36.6 C  Resp: 18    Complications: No apparent anesthesia complications

## 2015-05-30 NOTE — Anesthesia Preprocedure Evaluation (Addendum)
Anesthesia Evaluation  Patient identified by MRN, date of birth, ID band Patient awake    Reviewed: Allergy & Precautions, NPO status , Patient's Chart, lab work & pertinent test results  History of Anesthesia Complications Negative for: history of anesthetic complications  Airway Mallampati: III  TM Distance: >3 FB Neck ROM: Full    Dental no notable dental hx. (+) Poor Dentition, Dental Advisory Given   Pulmonary neg pulmonary ROS,    Pulmonary exam normal breath sounds clear to auscultation       Cardiovascular hypertension, Pt. on medications + Peripheral Vascular Disease  Normal cardiovascular exam Rhythm:Regular Rate:Normal     Neuro/Psych Seizures -, Well Controlled, Poorly Controlled,  negative psych ROS   GI/Hepatic negative GI ROS, Neg liver ROS,   Endo/Other  negative endocrine ROS  Renal/GU Renal disease  negative genitourinary   Musculoskeletal  (+) Arthritis , Osteoarthritis,    Abdominal   Peds negative pediatric ROS (+)  Hematology negative hematology ROS (+)   Anesthesia Other Findings Chronic pain in hip and shoulders, fentanyl patch  Reproductive/Obstetrics negative OB ROS                             Anesthesia Physical Anesthesia Plan  ASA: III  Anesthesia Plan: General   Post-op Pain Management:    Induction: Intravenous  Airway Management Planned: Oral ETT  Additional Equipment:   Intra-op Plan:   Post-operative Plan: Extubation in OR  Informed Consent: I have reviewed the patients History and Physical, chart, labs and discussed the procedure including the risks, benefits and alternatives for the proposed anesthesia with the patient or authorized representative who has indicated his/her understanding and acceptance.   Dental advisory given  Plan Discussed with: CRNA  Anesthesia Plan Comments:         Anesthesia Quick Evaluation

## 2015-05-30 NOTE — Anesthesia Procedure Notes (Signed)
Procedure Name: Intubation Date/Time: 05/30/2015 11:01 AM Performed by: Carleene Cooper A Pre-anesthesia Checklist: Patient identified, Timeout performed, Emergency Drugs available, Suction available and Patient being monitored Patient Re-evaluated:Patient Re-evaluated prior to inductionOxygen Delivery Method: Circle system utilized Preoxygenation: Pre-oxygenation with 100% oxygen Intubation Type: IV induction Ventilation: Mask ventilation without difficulty Laryngoscope Size: Mac and 4 Grade View: Grade I Tube type: Oral Tube size: 7.5 mm Number of attempts: 1 Airway Equipment and Method: Stylet Placement Confirmation: ETT inserted through vocal cords under direct vision,  breath sounds checked- equal and bilateral and positive ETCO2 Secured at: 21 cm Tube secured with: Tape Dental Injury: Teeth and Oropharynx as per pre-operative assessment

## 2015-05-30 NOTE — Anesthesia Postprocedure Evaluation (Signed)
Anesthesia Post Note  Patient: Alisha Mullen  Procedure(s) Performed: Procedure(s) (LRB): LEFT TOTAL HIP ARTHROPLASTY ANTERIOR APPROACH (Left)  Patient location during evaluation: PACU Anesthesia Type: General Level of consciousness: awake and alert Pain management: pain level controlled Vital Signs Assessment: post-procedure vital signs reviewed and stable Respiratory status: spontaneous breathing, nonlabored ventilation, respiratory function stable and patient connected to nasal cannula oxygen Cardiovascular status: blood pressure returned to baseline and stable Postop Assessment: no signs of nausea or vomiting Anesthetic complications: no    Last Vitals:  Filed Vitals:   05/30/15 1330 05/30/15 1350  BP: 144/64 155/70  Pulse: 79 75  Temp: 36.3 C 36.4 C  Resp: 19 16    Last Pain:  Filed Vitals:   05/30/15 1356  PainSc: 3                  Evey Mcmahan JENNETTE

## 2015-05-30 NOTE — Progress Notes (Signed)
X-ray results noted 

## 2015-05-30 NOTE — Op Note (Signed)
NAME:  Alisha Mullen, Alisha Mullen NO.:  [id number]  MEDICAL RECORD NO.:  [id number]  LOCATION:  [facility]                         FACILITY:  [facility]  PHYSICIAN:  Lind Guest. Ninfa Linden, M.D.DATE OF BIRTH:  January 12, 1935  DATE OF PROCEDURE:  05/30/2015 DATE OF DISCHARGE:                              OPERATIVE REPORT   PREOPERATIVE DIAGNOSIS:  Primary osteoarthritis and degenerative joint disease, left hip.  POSTOPERATIVE DIAGNOSIS:  Primary osteoarthritis and degenerative joint disease, left hip.  PROCEDURE:  Left total hip arthroplasty through direct anterior approach.  IMPLANTS:  De[employer] Sector Gription acetabular component size 52, size 32+ 0 polyethylene liner, size 13 Corail femoral component with standard offset, size 32+ 1 ceramic hip ball.  SURGEON:  Lind Guest. Ninfa Linden, MD  ASSISTANT:  Erskine Emery, PA-C  ANESTHESIA:  General.  ANTIBIOTICS:  900 mg IV clindamycin.  BLOOD LOSS:  150 mL.  COMPLICATIONS:  None.  INDICATIONS:  Ms. Fiorito is a very pleasant 81 year old female with debilitating primary osteoarthritis involving her left hip.  It has gotten to where this has detrimentally affected her activities of daily living, her quality of life, and mobility.  She has tried and failed all forms of conservative treatment including several intra-articular steroid injections.  She has severe disease in her back and had multiple back surgeries.  She has had a left total knee arthroplasty as well.  X- ray of the left hip showed collapse of the femoral head with significant shortening, periarticular osteophytes, and joint space narrowing.  At this point, we have recommended total hip arthroplasty through direct anterior approach.  She understands the risk of acute blood loss anemia, nerve and vessel injury, fracture, infection, dislocation, DVT.  She understands our goals are decreased pain, improved mobility, and overall improved quality of life.  PROCEDURE  DESCRIPTION:  After informed consent was obtained, appropriate left hip was marked.  She was brought to the operating room, where general anesthesia was obtained while she was on her stretcher.  A Foley catheter was placed and both feet had traction boots applied to them. Next, she was placed supine on the Hana fracture table with the perineal post in place and both legs in inline skeletal traction devices, but no traction applied.  Her left operative hip was prepped and draped with DuraPrep and sterile drapes.  A time-out was called and she was identified as correct patient, correct left hip.  We then made incision inferior and posterior to the anterior superior iliac spine and carried this obliquely down the leg.  We dissected down the tensor fascia lata muscle and divided the tensor fascia in a longitudinal fashion, so we could proceed with a direct anterior approach to the hip.  We identified and cauterized lateral femoral circumflex vessels and then identified the hip capsule.  I opened up the hip capsule in an L-type format.  We then placed Cobra retractors around the medial and lateral femoral neck within the hip capsule and made our femoral neck cut with an oscillating saw proximal to lesser trochanter and completed this with an osteotome. We placed a corkscrew guide in the femoral head and removed the femoral head in its entirety and found it significantly  flattened and devoid of cartilage.  We then cleaned the acetabular remnants of acetabular labrum, and other debris.  We placed a bent Hohmann over the medial acetabular rim and then began reaming under direct visualization from a size 42 reamer up to a size 50 with all reamers under direct visualization and the last reamer also under direct fluoroscopy, so we could get our depth of reaming, our inclination, and anteversion.  Once we were pleased with this, we placed the real DePuy Sector Gription acetabular component size 50 and  a 32+ 0 polyethylene liner for that size acetabular component.  Attention was then turned to the femur. With the leg externally rotated to 100 degrees, extended and adducted, we were to place a Mueller retractor medially and a Hohmann retractor behind the greater trochanter.  We released the lateral joint capsule and used a box cutting osteotome to enter the femoral canal and a rongeur to lateralize.  We then began broaching from a size 8 broaching all the way up to a size 13.  With a size 13, we trialed a standard offset femoral neck and a 32+ 1 hip ball.  We brought the leg back over and up with traction and internal rotation, reducing the pelvis and we were pleased with offset, leg length, and stability, as well as range of motion.  We then dislocated the hip and removed the trial components. We then placed the real Corail femoral component size 13 with standard offset and a real 32+ 1 ceramic hip ball and reduced this in the acetabulum and again it was stable.  We then irrigated the soft tissues with normal saline solution using pulsatile lavage.  We closed the joint capsule with interrupted #1 Ethibond suture followed by running #1 Vicryl in the tensor fascia, 0 Vicryl in the deep tissue, 2-0 Vicryl in subcutaneous tissue, interrupted staples on the skin.  Xeroform and Aquacel dressing was applied and she was taken off the Hana table, awakened, extubated and taken to the recovery room in stable condition. All final counts were correct.  There were no complications noted.  Of note, Erskine Emery, PA-C, assisted in the entire case.  His assistance was crucial for facilitating all aspects of this case.     Lind Guest. Ninfa Linden, M.D.     CYB/MEDQ  D:  05/30/2015  T:  05/30/2015  Job:  [id number]

## 2015-05-31 LAB — BASIC METABOLIC PANEL
Anion gap: 10 (ref 5–15)
BUN: 15 mg/dL (ref 6–20)
CALCIUM: 8.6 mg/dL — AB (ref 8.9–10.3)
CO2: 26 mmol/L (ref 22–32)
Chloride: 105 mmol/L (ref 101–111)
Creatinine, Ser: 0.84 mg/dL (ref 0.44–1.00)
GFR calc Af Amer: >60 mL/min (ref >60)
GFR calc non Af Amer: >60 mL/min (ref >60)
GLUCOSE: 130 mg/dL — AB (ref 65–99)
POTASSIUM: 4.2 mmol/L (ref 3.5–5.1)
SODIUM: 141 mmol/L (ref 135–145)

## 2015-05-31 LAB — CBC
HCT: 34.1 % — ABNORMAL LOW (ref 36.0–46.0)
Hemoglobin: 11.5 g/dL — ABNORMAL LOW (ref 12.0–15.0)
MCH: 30.5 pg (ref 26.0–34.0)
MCHC: 33.7 g/dL (ref 30.0–36.0)
MCV: 90.5 fL (ref 78.0–100.0)
PLATELETS: 177 10*3/uL (ref 150–400)
RBC: 3.77 MIL/uL — AB (ref 3.87–5.11)
RDW: 13.6 % (ref 11.5–15.5)
WBC: 8.5 10*3/uL (ref 4.0–10.5)

## 2015-05-31 NOTE — Progress Notes (Signed)
Physical Therapy Treatment Patient Details Name: Alisha Mullen MRN: [id number] DOB: 1934/05/28 Today's Date: 05/31/2015    History of Present Illness L THR; hx of L TKR, L TSR, and back surg    PT Comments    Pt very motivated and progressing well with mobility.  Follow Up Recommendations  Home health PT     Equipment Recommendations  None recommended by PT    Recommendations for Other Services OT consult     Precautions / Restrictions Precautions Precautions: Fall Restrictions Weight Bearing Restrictions: No Other Position/Activity Restrictions: WBAT    Mobility  Bed Mobility Overal bed mobility: Needs Assistance Bed Mobility: Supine to Sit     Supine to sit: Min assist     General bed mobility comments: cues for sequence and use of R LE to self assist  Transfers Overall transfer level: Needs assistance Equipment used: Rolling walker (2 wheeled) Transfers: Sit to/from Stand Sit to Stand: Min guard         General transfer comment: cues for LE management and use of UEs to self assist  Ambulation/Gait Ambulation/Gait assistance: Min assist Ambulation Distance (Feet): 100 Feet Assistive device: Rolling walker (2 wheeled) Gait Pattern/deviations: Step-to pattern;Step-through pattern;Decreased step length - right;Decreased step length - left;Shuffle;Trunk flexed Gait velocity: decr   General Gait Details: cues for sequence, posture and position from [employer]            Wheelchair Mobility    Modified Rankin (Stroke Patients Only)       Balance                                    Cognition Arousal/Alertness: Awake/alert Behavior During Therapy: WFL for tasks assessed/performed Overall Cognitive Status: Within Functional Limits for tasks assessed                      Exercises Total Joint Exercises Ankle Circles/Pumps: AROM;Both;15 reps;Supine Quad Sets: AROM;Both;10 reps;Supine Heel Slides: AAROM;15  reps;Supine;Left Hip ABduction/ADduction: AAROM;Left;15 reps;Supine    General Comments        Pertinent Vitals/Pain Pain Assessment: 0-10 Pain Score: 2  Pain Location: L hip/thigh Pain Descriptors / Indicators: Aching;Sore Pain Intervention(s): Limited activity within patient's tolerance;Monitored during session;Premedicated before session;Ice applied    Home Living Family/patient expects to be discharged to:: Private residence Living Arrangements: Alone Available Help at Discharge: Family           Additional Comments: will stay at daughters  can borrow 3:1    Prior Function Level of Independence: Independent;Independent with assistive device(s)          PT Goals (current goals can now be found in the care plan section) Acute Rehab PT Goals Patient Stated Goal: Regain IND PT Goal Formulation: With patient Time For Goal Achievement: 06/04/15 Potential to Achieve Goals: Good Progress towards PT goals: Progressing toward goals    Frequency  7X/week    PT Plan Current plan remains appropriate    Co-evaluation             End of Session Equipment Utilized During Treatment: Gait belt Activity Tolerance: Patient tolerated treatment well Patient left: in chair;with call bell/phone within reach;with family/visitor present     Time: 0825-0853 PT Time Calculation (min) (ACUTE ONLY): 28 min  Charges:  $Gait Training: 8-22 mins $Therapeutic Exercise: 8-22 mins  G Codes:      Casmere Hollenbeck 2015-06-13, 12:24 PM

## 2015-05-31 NOTE — Evaluation (Signed)
Occupational Therapy Evaluation Patient Details Name: Alisha Mullen MRN: [id number] DOB: 1934-05-17 Today's Date: 05/31/2015    History of Present Illness L THR; hx of L TKR, L TSR, and back surg   Clinical Impression   This 81 year old female was admitted for the above surgery. She will have daughter's assistance for adls at home. Will follow in acute to further educate on safe bathroom transfers.    Follow Up Recommendations  Supervision/Assistance - 24 hour;No OT follow up    Equipment Recommendations   (pt will borrow 3:1)    Recommendations for Other Services       Precautions / Restrictions Precautions Precautions: Fall Restrictions Weight Bearing Restrictions: No Other Position/Activity Restrictions: WBAT      Mobility Bed Mobility               General bed mobility comments: oob  Transfers   Equipment used: Rolling walker (2 wheeled) Transfers: Sit to/from Stand Sit to Stand: Min guard         General transfer comment: for safety    Balance                                            ADL Overall ADL's : Needs assistance/impaired             Lower Body Bathing: Moderate assistance;Sit to/from stand       Lower Body Dressing: Maximal assistance;Sit to/from stand   Toilet Transfer: Min guard;Ambulation;BSC             General ADL Comments: pt can perform UB adls with set up.  She has a sock aide and used to have a reacher. Reviewed AE. Daughter will assist as needed.  She is unsure whether she will shower or sponge bathe.  Reviewed shower transfer sequence, but pt did not practice.  Educated on sidestepping through tight spaces and using stool or trash can to support LLE while on 3:1, if feet won't touch floor     Vision     Perception     Praxis      Pertinent Vitals/Pain Pain Score: 2  Pain Location: L thigh Pain Descriptors / Indicators: Aching Pain Intervention(s): Limited activity within patient's  tolerance;Monitored during session;Premedicated before session;Repositioned;Ice applied     Hand Dominance     Extremity/Trunk Assessment             Communication Communication Communication: No difficulties   Cognition Arousal/Alertness: Awake/alert Behavior During Therapy: WFL for tasks assessed/performed Overall Cognitive Status: Within Functional Limits for tasks assessed                     General Comments       Exercises       Shoulder Instructions      Home Living Family/patient expects to be discharged to:: Private residence Living Arrangements: Alone Available Help at Discharge: Family               Bathroom Shower/Tub: Walk-in shower (she thinks)   Biochemist, clinical: Standard         Additional Comments: will stay at daughters  can borrow 3:1      Prior Functioning/Environment Level of Independence: Independent;Independent with assistive device(s)             OT Diagnosis: Acute pain   OT Problem List: Decreased activity tolerance;Pain;Decreased  knowledge of use of DME or AE   OT Treatment/Interventions: Self-care/ADL training;DME and/or AE instruction;Patient/family education    OT Goals(Current goals can be found in the care plan section) Acute Rehab OT Goals Patient Stated Goal: Regain IND OT Goal Formulation: With patient Time For Goal Achievement: 06/07/15 Potential to Achieve Goals: Good ADL Goals Pt Will Transfer to Toilet: with supervision;ambulating;bedside commode Pt Will Perform Tub/Shower Transfer: Shower transfer;with min guard assist;3 in 1;ambulating  OT Frequency: Min 2X/week   Barriers to D/C:            Co-evaluation              End of Session    Activity Tolerance: Patient tolerated treatment well Patient left: in chair;with call bell/phone within reach;with family/visitor present   Time: [id number] OT Time Calculation (min): 21 min Charges:  OT General Charges $OT Visit: 1 Procedure OT  Evaluation $OT Eval Low Complexity: 1 Procedure G-Codes:    Franck Vinal 06-08-15, 10:11 AM  Lesle Chris, OTR/L [phone] 06-08-2015

## 2015-05-31 NOTE — Progress Notes (Signed)
Physical Therapy Treatment Patient Details Name: Alisha Mullen MRN: [id number] DOB: [birth year] Today's Date: 06-05-15    History of Present Illness L THR; hx of L TKR, L TSR, and back surg    PT Comments    Pt continues motivated and cooperative but ltd this pm by increasing fatigue.  Follow Up Recommendations  Home health PT     Equipment Recommendations  Rolling walker with 5" wheels (Youth level RW please)    Recommendations for Other Services OT consult     Precautions / Restrictions Precautions Precautions: Fall Restrictions Weight Bearing Restrictions: No Other Position/Activity Restrictions: WBAT    Mobility  Bed Mobility Overal bed mobility: Needs Assistance Bed Mobility: Sit to Supine       Sit to supine: Min assist   General bed mobility comments: cues for sequence and use of R LE to self assist  Transfers Overall transfer level: Needs assistance Equipment used: Rolling walker (2 wheeled) Transfers: Sit to/from Stand Sit to Stand: Min guard         General transfer comment: cues for LE management and use of UEs to self assist  Ambulation/Gait Ambulation/Gait assistance: Min assist Ambulation Distance (Feet): 85 Feet (and 20' into bathroom) Assistive device: Rolling walker (2 wheeled) Gait Pattern/deviations: Step-to pattern;Decreased step length - right;Decreased step length - left;Shuffle;Trunk flexed Gait velocity: decr   General Gait Details: cues for sequence, posture and position from [employer]            Wheelchair Mobility    Modified Rankin (Stroke Patients Only)       Balance                                    Cognition Arousal/Alertness: Awake/alert Behavior During Therapy: WFL for tasks assessed/performed Overall Cognitive Status: Within Functional Limits for tasks assessed                      Exercises      General Comments        Pertinent Vitals/Pain Pain Assessment:  Faces Faces Pain Scale: Hurts little more Pain Location: L hip/thigh Pain Descriptors / Indicators: Aching;Burning Pain Intervention(s): Limited activity within patient's tolerance;Monitored during session;Premedicated before session;Other (comment);Ice applied (Muscle Relaxer requested)    Home Living                      Prior Function            PT Goals (current goals can now be found in the care plan section) Acute Rehab PT Goals Patient Stated Goal: Regain IND PT Goal Formulation: With patient Time For Goal Achievement: 06/04/15 Potential to Achieve Goals: Good Progress towards PT goals: Progressing toward goals    Frequency  7X/week    PT Plan Current plan remains appropriate    Co-evaluation             End of Session Equipment Utilized During Treatment: Gait belt Activity Tolerance: Patient limited by fatigue Patient left: in bed;with call bell/phone within reach;with family/visitor present     Time: 1450-1520 PT Time Calculation (min) (ACUTE ONLY): 30 min  Charges:  $Gait Training: 8-22 mins $Therapeutic Activity: 8-22 mins                    G Codes:      Alisha Mullen 06-05-2015, 4:18 PM

## 2015-05-31 NOTE — Care Management Note (Addendum)
Case Management Note  Patient Details  Name: Alisha Mullen MRN: [id number] Date of Birth: 01/24/1935  Subjective/Objective:                  LEFT TOTAL HIP ARTHROPLASTY ANTERIOR APPROACH   Action/Plan: CM spoke with patient at the bedside. Patient states she would like [facility] for [place]. She states she has already spoken with them and has a family member who works from them. She has a RW and 3N1 at home. She will be discharged to a family member's home at [address], [place], [place]. CM will fax orders and H&P to [facility], fax [phone], phone [phone]. Referral called to Cecille Rubin, nurse, at [facility].   Expected Discharge Date:  06/01/15               Expected Discharge Plan:  [place]  In-House Referral:     Discharge planning Services  CM Consult  Post Acute Care Choice:  Home Health Choice offered to:  Patient  DME Arranged:  N/A DME Agency:  NA  HH Arranged:  PT HH Agency:  [facility]  Status of Service:  Completed, signed off  Medicare Important Message Given:    Date Medicare IM Given:    Medicare IM give by:    Date Additional Medicare IM Given:    Additional Medicare Important Message give by:     If discussed at [place] of Stay Meetings, dates discussed:    Additional Comments:  Apolonio Schneiders, RN 05/31/2015, 10:03 AM

## 2015-05-31 NOTE — Progress Notes (Signed)
   Subjective:  Patient reports pain as moderate.  Walked down hallway with PT.  Objective:   VITALS:   Filed Vitals:   05/30/15 1513 05/30/15 1630 05/31/15 0101 05/31/15 0527  BP: 166/91 162/69 133/44 134/53  Pulse: 82 69 79 66  Temp: 97.7 F (36.5 C) 97.4 F (36.3 C) 98.1 F (36.7 C) 98.2 F (36.8 C)  TempSrc: Oral Oral Oral Oral  Resp: 15 15 16 16   Height:      Weight:      SpO2: 99% 100% 99% 100%    Neurologically intact Neurovascular intact Sensation intact distally Intact pulses distally Dorsiflexion/Plantar flexion intact Incision: dressing C/D/I and no drainage No cellulitis present Compartment soft   Lab Results  Component Value Date   WBC 8.5 05/31/2015   HGB 11.5* 05/31/2015   HCT 34.1* 05/31/2015   MCV 90.5 05/31/2015   PLT 177 05/31/2015     Assessment/Plan:  1 Day Post-Op   - Expected postop acute blood loss anemia - will monitor for symptoms - Up with PT/OT - DVT ppx - SCDs, ambulation, asa - WBAT operative extremity - Pain control - Discharge planning  Marianna Payment 05/31/2015, 9:31 AM [phone]

## 2015-06-01 NOTE — Progress Notes (Signed)
Occupational Therapy Treatment Patient Details Name: Alisha Mullen MRN: [id number] DOB: January 07, 1935 Today's Date: 06/01/2015    History of present illness L THR; hx of L TKR, L TSR, and back surg   OT comments  Pt doing well. Practiced up to the bathroom and 3in1 with walker and pt needed min cues for safety. She was slightly fatigued after session. Pain reported as 2/10 with activity. Family will be assisting once pt d/c home. Will follow.   Follow Up Recommendations  Supervision/Assistance - 24 hour;No OT follow up    Equipment Recommendations  Other (comment) (borrowing 3in1)    Recommendations for Other Services      Precautions / Restrictions Precautions Precautions: Fall Restrictions Weight Bearing Restrictions: No Other Position/Activity Restrictions: WBAT       Mobility Bed Mobility               General bed mobility comments: in chair.   Transfers Overall transfer level: Needs assistance Equipment used: Rolling walker (2 wheeled) Transfers: Sit to/from Stand Sit to Stand: Min guard         General transfer comment: cues for hand placement.    Balance                                   ADL                           Toilet Transfer: Min guard;Ambulation;BSC;RW             General ADL Comments: Pt states she has decided to sponge bathe for a week or so and that she would like to be off the walker and using a cane before she showers. She states she doesnt want to fall in the shower. Discussed use of 3in1 as shower chair when she does decide to shower initiallly for safety and also having [place] practice correct shower transfer technique with her before performing with family. She states she will have assist at d/c for showering and ADL. She is familiar with sock aid from using it PTA. Needed min cues today for safety with hand placement on 3in1 to push up and not letting go of walker prematurely to back up to toilet. Also gave  min cues to turn and face sink with walker and not twist to the side to reach for sink.       Vision                     Perception     Praxis      Cognition   Behavior During Therapy: WFL for tasks assessed/performed Overall Cognitive Status: Within Functional Limits for tasks assessed                       Extremity/Trunk Assessment               Exercises     Shoulder Instructions       General Comments      Pertinent Vitals/ Pain       Pain Assessment: 0-10 Pain Score: 2  Pain Location: L hip Pain Descriptors / Indicators: Discomfort Pain Intervention(s): Monitored during session;Repositioned;Ice applied  Home Living  Prior Functioning/Environment              Frequency Min 2X/week     Progress Toward Goals  OT Goals(current goals can now be found in the care plan section)  Progress towards OT goals: Progressing toward goals     Plan Discharge plan remains appropriate    Co-evaluation                 End of Session Equipment Utilized During Treatment: Rolling walker   Activity Tolerance Patient tolerated treatment well   Patient Left in chair;with call bell/phone within reach;with family/visitor present   Nurse Communication          Time: [id number] OT Time Calculation (min): 26 min  Charges: OT General Charges $OT Visit: 1 Procedure OT Treatments $Self Care/Home Management : 8-22 mins $Therapeutic Activity: 8-22 mins  Pauline Aus Mangum 06/01/2015, 9:02 AM

## 2015-06-01 NOTE — Progress Notes (Signed)
   Subjective:  Patient reports pain as moderate.  Progressing with PT  Objective:   VITALS:   Filed Vitals:   05/31/15 1156 05/31/15 1400 05/31/15 2004 06/01/15 0407  BP: 129/94 152/64 132/82 132/45  Pulse: 84 93 96 97  Temp: 97.1 F (36.2 C) 98.2 F (36.8 C) 99.9 F (37.7 C) 98.5 F (36.9 C)  TempSrc: Oral Oral Oral Oral  Resp:   17 16  Height:      Weight:      SpO2: 98% 98% 96% 96%    Neurologically intact Neurovascular intact Sensation intact distally Intact pulses distally Dorsiflexion/Plantar flexion intact Incision: dressing C/D/I and no drainage No cellulitis present Compartment soft   Lab Results  Component Value Date   WBC 8.5 05/31/2015   HGB 11.5* 05/31/2015   HCT 34.1* 05/31/2015   MCV 90.5 05/31/2015   PLT 177 05/31/2015     Assessment/Plan:  2 Days Post-Op   - Expected postop acute blood loss anemia - Hgb stable - Up with PT/OT - DVT ppx - SCDs, ambulation, asa - WBAT operative extremity - Pain controlled - Discharge planning - home tomorrow  Marianna Payment 06/01/2015, 8:53 AM [phone]

## 2015-06-01 NOTE — Progress Notes (Signed)
Physical Therapy Treatment Patient Details Name: Alisha Mullen MRN: [id number] DOB: 1935-01-12 Today's Date: 2015/06/19    History of Present Illness L THR; hx of L TKR, L TSR, and back surg    PT Comments    Progressing well  Follow Up Recommendations  Home health PT     Equipment Recommendations  Rolling walker with 5" wheels (youth ht)    Recommendations for Other Services       Precautions / Restrictions Precautions Precautions: Fall Restrictions Weight Bearing Restrictions: No Other Position/Activity Restrictions: WBAT    Mobility  Bed Mobility Overal bed mobility: Needs Assistance Bed Mobility: Sit to Supine       Sit to supine: Min assist   General bed mobility comments: assist with LLE  Transfers Overall transfer level: Needs assistance Equipment used: Rolling walker (2 wheeled) Transfers: Sit to/from Stand Sit to Stand: Min guard;Supervision         General transfer comment: cues for hand placement.  Ambulation/Gait Ambulation/Gait assistance: Supervision Ambulation Distance (Feet): 110 Feet Assistive device: Rolling walker (2 wheeled) Gait Pattern/deviations: Step-to pattern;Step-through pattern;Decreased stride length     General Gait Details: cues for sequence, posture and position from RW   Stairs Stairs: Yes Stairs assistance: Min guard Stair Management: Two rails;Step to pattern;Forwards Number of Stairs: 3 General stair comments: cues for sequence for descent  Wheelchair Mobility    Modified Rankin (Stroke Patients Only)       Balance                                    Cognition Arousal/Alertness: Awake/alert Behavior During Therapy: WFL for tasks assessed/performed Overall Cognitive Status: Within Functional Limits for tasks assessed                      Exercises      General Comments        Pertinent Vitals/Pain Pain Assessment: 0-10 Pain Score: 1  Pain Location: L hip Pain  Descriptors / Indicators: Discomfort Pain Intervention(s): Limited activity within patient's tolerance;Monitored during session;Premedicated before session;Repositioned;Ice applied    Home Living                      Prior Function            PT Goals (current goals can now be found in the care plan section) Acute Rehab PT Goals Patient Stated Goal: Regain IND PT Goal Formulation: With patient Time For Goal Achievement: 06/04/15 Potential to Achieve Goals: Good Progress towards PT goals: Progressing toward goals    Frequency  7X/week    PT Plan Current plan remains appropriate    Co-evaluation             End of Session   Activity Tolerance: Patient tolerated treatment well Patient left: in bed;with call bell/phone within reach;with bed alarm set;with family/visitor present     Time: 1035-1050 PT Time Calculation (min) (ACUTE ONLY): 15 min  Charges:  $Gait Training: 8-22 mins                    G Codes:      Reedy Biernat 06/19/2015, 11:32 AM

## 2015-06-01 NOTE — Progress Notes (Signed)
   06/01/15 1400  PT Visit Information  Last PT Received On 06/01/15  Assistance Needed +1  History of Present Illness L THR; hx of L TKR, L TSR, and back surg  PT Time Calculation  PT Start Time (ACUTE ONLY) 1429  PT Stop Time (ACUTE ONLY) 1448  PT Time Calculation (min) (ACUTE ONLY) 19 min  Subjective Data  Patient Stated Goal Regain IND  Precautions  Precautions Fall  Restrictions  Other Position/Activity Restrictions WBAT  Pain Assessment  Pain Assessment 0-10  Pain Score 1  Pain Location L hip  Pain Descriptors / Indicators Sore  Pain Intervention(s) Limited activity within patient's tolerance;Monitored during session;Premedicated before session;Ice applied;Repositioned  Cognition  Arousal/Alertness Awake/alert  Behavior During Therapy WFL for tasks assessed/performed  Overall Cognitive Status Within Functional Limits for tasks assessed  Bed Mobility  Overal bed mobility Needs Assistance  Bed Mobility Sit to Supine  Sit to supine Min assist  General bed mobility comments assist with LLE  Transfers  Overall transfer level Needs assistance  Equipment used Rolling walker (2 wheeled)  Transfers Sit to/from Stand  Sit to Stand Min guard;Supervision  General transfer comment cues for hand placement.  Ambulation/Gait  Ambulation/Gait assistance Min guard  Ambulation Distance (Feet) 120 Feet (10' more to bathroom)  Assistive device Rolling walker (2 wheeled)  Gait Pattern/deviations Step-through pattern;Decreased stride length;Trunk flexed  General Gait Details cues for sequence, posture and position from RW  Gait velocity decr  Total Joint Exercises  Ankle Circles/Pumps AROM;Both;15 reps;Supine  Quad Sets AROM;Strengthening;Both;15 reps;Supine  Short Arc Quad Strengthening;AROM;Left;15 reps;Supine  Heel Slides AAROM;15 reps;Supine;Left  Hip ABduction/ADduction AAROM;Left;15 reps;Supine  PT - End of Session  Activity Tolerance Patient tolerated treatment well  Patient  left in bed;with call bell/phone within reach;with bed alarm set;with family/visitor present  Nurse Communication Mobility status  PT - Assessment/Plan  PT Plan Current plan remains appropriate  PT Frequency (ACUTE ONLY) 7X/week  Follow Up Recommendations Home health PT  PT equipment Rolling walker with 5" wheels (youth ht)  PT Goal Progression  Progress towards PT goals Progressing toward goals  Acute Rehab PT Goals  Time For Goal Achievement 06/04/15  Potential to Achieve Goals Good  PT General Charges  $$ ACUTE PT VISIT 1 Procedure  PT Treatments  $Gait Training 8-22 mins

## 2015-06-02 MED ORDER — OXYCODONE HCL 5 MG PO TABS: 5.0000 mg | ORAL_TABLET | ORAL | Status: DC | PRN

## 2015-06-02 MED ORDER — ASPIRIN 325 MG PO TBEC: 325.0000 mg | DELAYED_RELEASE_TABLET | Freq: Two times a day (BID) | ORAL | Status: DC

## 2015-06-02 NOTE — Progress Notes (Signed)
Occupational Therapy Treatment Patient Details Name: Alisha Mullen MRN: [id number] DOB: September 01, 1934 Today's Date: 06/02/2015    History of present illness L THR; hx of L TKR, L TSR, and back surg   OT comments  HHOT added  Follow Up Recommendations  Supervision/Assistance - 24 hour;Home health OT    Equipment Recommendations  Other (comment) (borrowing 3in1)       Precautions / Restrictions Precautions Precautions: Fall Restrictions Weight Bearing Restrictions: No Other Position/Activity Restrictions: WBAT       Mobility Bed Mobility Overal bed mobility: Needs Assistance Bed Mobility: Sit to Supine       Sit to supine: Min assist   General bed mobility comments: assist with LLE  Transfers Overall transfer level: Needs assistance Equipment used: Rolling walker (2 wheeled) Transfers: Sit to/from Stand Sit to Stand: Supervision         General transfer comment: cues for hand placement.        ADL Overall ADL's : Needs assistance/impaired                                   Tub/Shower Transfer Details (indicate cue type and reason): verbalized safety but wants to practice with [facility] therapiot   General ADL Comments: Reinterated strategies with ADL activity. Pt does want HHOT at home as her daugther is not comfortable helpiing her with ADL activity . Pt wants to make sure she is able to perform  tasks at [facility].                Cognition   Behavior During Therapy: WFL for tasks assessed/performed Overall Cognitive Status: Within Functional Limits for tasks assessed                                    Pertinent Vitals/ Pain       Pain Assessment: 0-10 Pain Score: 2  Pain Location: L hip Pain Descriptors / Indicators: Aching;Sore Pain Intervention(s): Limited activity within patient's tolerance;Monitored during session;Premedicated before session;Ice applied     Prior Functioning/Environment              Frequency Min  2X/week     Progress Toward Goals  OT Goals(current goals can now be found in the care plan section)     Acute Rehab OT Goals Patient Stated Goal: [place] Discharge plan needs to be updated       End of Session Equipment Utilized During Treatment: Rolling walker   Activity Tolerance Patient tolerated treatment well   Patient Left in chair;with call bell/phone within reach;with family/visitor present   Nurse Communication          Time: [id number] OT Time Calculation (min): 10 min  Charges: OT General Charges $OT Visit: 1 Procedure OT Treatments $Self Care/Home Management : 8-22 mins  Eluzer Howdeshell, Thereasa Parkin 06/02/2015, 12:28 PM

## 2015-06-02 NOTE — Progress Notes (Signed)
Physical Therapy Treatment Patient Details Name: Alisha Mullen MRN: [id number] DOB: 1934-11-13 Today's Date: 06/02/2015    History of Present Illness L THR; hx of L TKR, L TSR, and back surg    PT Comments    Pt progressing well with mobility and eager for return home.  Follow Up Recommendations  Home health PT     Equipment Recommendations  Rolling walker with 5" wheels    Recommendations for Other Services OT consult     Precautions / Restrictions Precautions Precautions: Fall Restrictions Weight Bearing Restrictions: No Other Position/Activity Restrictions: WBAT    Mobility  Bed Mobility Overal bed mobility: Needs Assistance Bed Mobility: Sit to Supine       Sit to supine: Min assist   General bed mobility comments: assist with LLE  Transfers Overall transfer level: Needs assistance Equipment used: Rolling walker (2 wheeled) Transfers: Sit to/from Stand Sit to Stand: Supervision         General transfer comment: cues for hand placement.  Ambulation/Gait Ambulation/Gait assistance: Min guard;Supervision Ambulation Distance (Feet): 200 Feet Assistive device: Rolling walker (2 wheeled) Gait Pattern/deviations: Step-to pattern;Step-through pattern;Decreased step length - right;Decreased step length - left;Shuffle;Trunk flexed Gait velocity: decr   General Gait Details: cues for sequence, posture and position from RW   Stairs Stairs: Yes Stairs assistance: Min guard Stair Management: Two rails;Step to pattern;Forwards Number of Stairs: 3 General stair comments: Pt self cueing for sequence  Wheelchair Mobility    Modified Rankin (Stroke Patients Only)       Balance                                    Cognition Arousal/Alertness: Awake/alert Behavior During Therapy: WFL for tasks assessed/performed Overall Cognitive Status: Within Functional Limits for tasks assessed                      Exercises Total Joint  Exercises Ankle Circles/Pumps: AROM;Both;15 reps;Supine Quad Sets: AROM;Strengthening;Both;15 reps;Supine Short Arc Quad: Strengthening;AROM;Left;15 reps;Supine Heel Slides: AAROM;Supine;Left;20 reps Hip ABduction/ADduction: AAROM;Left;15 reps;Supine    General Comments        Pertinent Vitals/Pain Pain Assessment: 0-10 Pain Score: 2  Pain Location: L hip Pain Descriptors / Indicators: Aching;Sore Pain Intervention(s): Limited activity within patient's tolerance;Monitored during session;Premedicated before session;Ice applied    Home Living                      Prior Function            PT Goals (current goals can now be found in the care plan section) Acute Rehab PT Goals Patient Stated Goal: Regain IND PT Goal Formulation: With patient Time For Goal Achievement: 06/04/15 Potential to Achieve Goals: Good Progress towards PT goals: Progressing toward goals    Frequency  7X/week    PT Plan Current plan remains appropriate    Co-evaluation             End of Session Equipment Utilized During Treatment: Gait belt Activity Tolerance: Patient tolerated treatment well Patient left: in bed;with call bell/phone within reach;with bed alarm set     Time: 0940-1010 PT Time Calculation (min) (ACUTE ONLY): 30 min  Charges:  $Gait Training: 8-22 mins $Therapeutic Exercise: 8-22 mins                    G Codes:      Alisha Mullen  06/02/2015, 12:02 PM

## 2015-06-02 NOTE — Discharge Summary (Signed)

## 2015-06-02 NOTE — Discharge Instructions (Signed)

## 2015-06-02 NOTE — Progress Notes (Signed)
Subjective: 3 Days Post-Op Procedure(s) (LRB): LEFT TOTAL HIP ARTHROPLASTY ANTERIOR APPROACH (Left) Patient reports pain as moderate.    Objective: Vital signs in last 24 hours: Temp:  [98 F (36.7 C)-98.8 F (37.1 C)] 98.7 F (37.1 C) (03/27 0500) Pulse Rate:  [83-88] 84 (03/27 0500) Resp:  [15-16] 16 (03/27 0500) BP: (120-161)/(60-64) 120/60 mmHg (03/27 0500) SpO2:  [96 %-97 %] 96 % (03/27 0500)  Intake/Output from previous day: 03/26 0701 - 03/27 0700 In: 1200 [P.O.:1200] Out: -  Intake/Output this shift:     Recent Labs  05/31/15 0402  HGB 11.5*    Recent Labs  05/31/15 0402  WBC 8.5  RBC 3.77*  HCT 34.1*  PLT 177    Recent Labs  05/31/15 0402  NA 141  K 4.2  CL 105  CO2 26  BUN 15  CREATININE 0.84  GLUCOSE 130*  CALCIUM 8.6*   No results for input(s): LABPT, INR in the last 72 hours.  Sensation intact distally Intact pulses distally Dorsiflexion/Plantar flexion intact Incision: scant drainage  Assessment/Plan: 3 Days Post-Op Procedure(s) (LRB): LEFT TOTAL HIP ARTHROPLASTY ANTERIOR APPROACH (Left) Up with therapy Discharge home with home health today.  Daena Alper Y 06/02/2015, 7:23 AM

## 2016-01-12 ENCOUNTER — Other Ambulatory Visit: Payer: Self-pay | Admitting: Family Medicine

## 2016-01-12 DIAGNOSIS — Z1231 Encounter for screening mammogram for malignant neoplasm of breast: Secondary | ICD-10-CM

## 2016-02-09 ENCOUNTER — Telehealth (INDEPENDENT_AMBULATORY_CARE_PROVIDER_SITE_OTHER): Payer: Self-pay | Admitting: *Deleted

## 2016-02-09 NOTE — Telephone Encounter (Signed)
Pt called stating she has a little pocket on left hip. Pt stated it is not red or warm and just wants to know if she needs to make an appt.

## 2016-02-09 NOTE — Telephone Encounter (Signed)
We probably need to see her (me or Artis Delay) sometime in the next week to assess this.

## 2016-02-10 NOTE — Telephone Encounter (Signed)
Can you put her in Thursday afternoon for me please

## 2016-02-12 NOTE — Telephone Encounter (Signed)
LVM 12/6 for pt to call and schedule appt

## 2016-02-18 ENCOUNTER — Ambulatory Visit
Admission: RE | Admit: 2016-02-18 | Discharge: 2016-02-18 | Disposition: A | Discharge status: Home / Self Care | Payer: Medicare Other | Source: Ambulatory Visit | Attending: Family Medicine | Admitting: Family Medicine

## 2016-02-18 DIAGNOSIS — Z1231 Encounter for screening mammogram for malignant neoplasm of breast: Secondary | ICD-10-CM

## 2016-04-23 ENCOUNTER — Ambulatory Visit: Payer: Medicare Other | Admitting: Podiatry

## 2016-04-26 ENCOUNTER — Ambulatory Visit: Payer: Medicare Other | Admitting: Podiatry

## 2016-05-03 ENCOUNTER — Ambulatory Visit (INDEPENDENT_AMBULATORY_CARE_PROVIDER_SITE_OTHER): Payer: Medicare Other | Admitting: Podiatry

## 2016-05-03 ENCOUNTER — Encounter: Payer: Self-pay | Admitting: Podiatry

## 2016-05-03 ENCOUNTER — Ambulatory Visit (INDEPENDENT_AMBULATORY_CARE_PROVIDER_SITE_OTHER): Payer: Medicare Other

## 2016-05-03 VITALS — BP 165/85 | HR 71 | Resp 16

## 2016-05-03 DIAGNOSIS — M722 Plantar fascial fibromatosis: Secondary | ICD-10-CM

## 2016-05-03 NOTE — Progress Notes (Signed)
   Subjective:    Patient ID: Alisha Mullen, female    DOB: 1934/07/10, 82 y.o.   MRN: [id number]  HPI she presents today complaining of left plantar pain for about 3 weeks. She states that is a little better than it was initially she states that we had to treat the same area about 3 years ago thinks that is just aggravated. She states that she feels like she may have numbness when she was moving wood into the house.    Review of Systems  Constitutional: Positive for unexpected weight change.  HENT: Positive for sneezing and tinnitus.   Respiratory: Positive for shortness of breath.   Cardiovascular: Positive for leg swelling.  Musculoskeletal: Positive for arthralgias, back pain, gait problem and myalgias.  Hematological: Bruises/bleeds easily.  All other systems reviewed and are negative.      Objective:   Physical Exam: Vital signs are stable alert, blood pressure slightly elevated 165/85 she is alert and oriented 3. Pulses are palpable. Neurologic sensorium is intact. Deep tendon reflexes are intact. Muscle strength +5 over 5 dorsiflexion plantar flexors and inverters everters onto the musculature is intact. She has pain on palpation medially continue tubercle of the left heel. Radiographs taken today do not demonstrate any type of osseus abnormalities of the soft tissue increase in density plantar fascial insertion site of the left heel.        Assessment & Plan:  Plantar fasciitis left.  Plan: Injected left heel today with a long local anesthetic discussed appropriate shoe gear stretching exercises ice therapy. I also placed her in a plantar fascia brace. Follow up with her in 1 month

## 2016-05-03 NOTE — Patient Instructions (Signed)

## 2016-05-17 ENCOUNTER — Ambulatory Visit (INDEPENDENT_AMBULATORY_CARE_PROVIDER_SITE_OTHER): Payer: Medicare Other

## 2016-05-17 ENCOUNTER — Ambulatory Visit (INDEPENDENT_AMBULATORY_CARE_PROVIDER_SITE_OTHER): Payer: Medicare Other | Admitting: Physician Assistant

## 2016-05-17 ENCOUNTER — Encounter (INDEPENDENT_AMBULATORY_CARE_PROVIDER_SITE_OTHER): Payer: Self-pay | Admitting: Physician Assistant

## 2016-05-17 DIAGNOSIS — Z96642 Presence of left artificial hip joint: Secondary | ICD-10-CM | POA: Diagnosis not present

## 2016-05-17 NOTE — Progress Notes (Signed)
Office Visit Note   Patient: Alisha Mullen           Date of Birth: Dec 08, 1934           MRN: [id number] Visit Date: 05/17/2016              Requested by: Alisha Late, MD [phone] [facility] [facility] [place], [place] [address] PCP: Alisha Fennel, MD   Assessment & Plan: Visit Diagnoses:  1. History of left hip replacement     Plan:  She will work on IT band stretching exercises which I reviewed with her today we will see her back on an as-needed basis. I did discuss with her that if her right knee pain continues or becomes worse than oh like to obtain AP and lateral views of the right knee. I did discuss with her that she had some arthritis in the right hip and that this could be the source of her knee pain.   Follow-Up Instructions: Return if symptoms worsen or fail to improve.   Orders:  Orders Placed This Encounter  Procedures  . XR HIP UNILAT W OR W/O PELVIS 2-3 VIEWS LEFT   No orders of the defined types were placed in this encounter.     Procedures: No procedures performed   Clinical Data: No additional findings.    Subjective: Chief Complaint  Patient presents with  . Left Hip - Follow-up    HPI Alisha Mullen returns today for follow-up of her left total hip arthroplasty which was performed 05/30/2015. She overall is doing well but still has some swelling and it feels if she has not over the incision site. She states that she has some pain in the lateral aspect of the left hip at times but this comes and goes. And well-nourished white is on the hip at night at times currently she's having no real discomfort. She does have some right knee pain. Review of Systems   Objective: Vital Signs: There were no vitals taken for this visit.  Physical Exam  Constitutional: She is oriented to person, place, and time. She appears well-developed and well-nourished. No distress.  Neurological: She is alert and oriented to person,  place, and time.  Psychiatric: She has a normal mood and affect.    Ortho Exam Good range of motion of both hips without significant pain. Tenderness over the left hip trochanteric region. Surgical incision is healed well. I see no signs of infection or signs of seroma. Right knee full range of motion without pain. She has tenderness lateral joint line of the right knee. No instability valgus varus stressing. No effusion or abnormal warmth.  Specialty Comments:  No specialty comments available.  Imaging: Xr Hip Unilat W Or W/o Pelvis 2-3 Views Left  Result Date: 05/17/2016 AP pelvis and lateral view of the left hip shows the left total hip arthroplasty components to be only good position alignment. No signs of loosening or hardware failure. Moderate arthritic changes of the right hip. No acute fracture    PMFS History: Patient Active Problem List   Diagnosis Date Noted  . Status post total replacement of left hip 05/30/2015  . Grade 1 Anterolisthesis of L5 over S1 with fusion 02/11/2015  . Lumbar foraminal stenosis (Moderate Left L1-2; Severe Left L2-3) 02/11/2015  . Gluteal muscle Tendinosis 01/23/2015  . Osteoarthritis of hip (Left) 01/23/2015  . Obesity, Class II, BMI 35-39.9 01/23/2015  . Lumbar spondylosis 01/23/2015  .  Osteoarthrosis 01/23/2015  . Lumbar spinal stenosis (Mild L2-3, L3-4, L4-5) 01/23/2015  . Lumbosacral foraminal stenosis (Left L1-2, L2-3) 01/23/2015  . Lumbar facet hypertrophy (Bilateral L3-4 and L4-5) 01/23/2015  . Lumbar postlaminectomy syndrome (L4 laminectomy and L4-5 PLIF) 01/23/2015  . Long term current use of opiate analgesic 01/13/2015  . Long term prescription opiate use 01/13/2015  . Opiate use 01/13/2015  . Opiate dependence ([place]) 01/13/2015  . Encounter for therapeutic drug level monitoring 01/13/2015  . 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 Chronic low back pain 01/13/2015  . Chronic lower extremity pain (Left) 01/13/2015  .  Chronic lumbar radicular pain (Left) 01/13/2015  . Myofascial pain syndrome (buttocks area) (Left) 01/09/2015  . Musculoskeletal pain 01/09/2015  . Chronic pain 01/01/2015  . Chronic hip pain (Left) 01/01/2015  . Other long term (current) drug therapy 01/01/2015  . History of lumbosacral spine surgery 01/01/2015  . Epilepsy ([place]) 06/11/2011  . Hypercholesterolemia 06/11/2011  . BP (high blood pressure) 06/11/2011  . Adiposity 06/11/2011  . Osteoporosis, post-menopausal 06/11/2011  . Leg varices 06/11/2011   Past Medical History:  Diagnosis Date  . Arthritis   . Bruises easily   . 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 Chronic kidney disease    decreased rt kidney function - followed by [facility]  . Diverticulosis   . Seizures ([place])    last seizure 10 yrs ago  "controlled epilepsy"    Family History  Problem Relation Age of Onset  . Cancer Mother   . Hypertension Mother   . Heart disease Father   . Breast cancer Neg Hx     Past Surgical History:  Procedure Laterality Date  . ABDOMINAL HYSTERECTOMY    . APPENDECTOMY    . BACK SURGERY  2011  . BREAST EXCISIONAL BIOPSY Right    surgical bx neg  . BREAST EXCISIONAL BIOPSY Right    surgical neg  . BREAST SURGERY  1970 / 1980   benign lumpectomy  . CARPAL TUNNEL RELEASE    . CATARACT EXTRACTION BILATERAL W/ ANTERIOR VITRECTOMY Bilateral   . CHOLECYSTECTOMY    . FOOT SURGERY Right 2005   both right and left feet  . JOINT REPLACEMENT     total L shoulder / total L knee  . REPLACEMENT TOTAL KNEE Left 2001  . SHOULDER SURGERY Right   . TONSILLECTOMY    . TOTAL HIP ARTHROPLASTY Left 05/30/2015   Procedure: LEFT TOTAL HIP ARTHROPLASTY ANTERIOR APPROACH;  Surgeon: Mcarthur Rossetti, MD;  Location: [facility];  Service: Orthopedics;  Laterality: Left;  . TOTAL SHOULDER REPLACEMENT Left 2007   Social History   Occupational History  . Not on file.   Social History Main Topics  . Smoking  status: Never Smoker  . Smokeless tobacco: Never Used  . Alcohol use No  . Drug use: No  . Sexual activity: Not on file

## 2016-05-31 ENCOUNTER — Ambulatory Visit: Payer: Medicare Other | Admitting: Podiatry

## 2016-06-16 DIAGNOSIS — R0609 Other forms of dyspnea: Secondary | ICD-10-CM | POA: Insufficient documentation

## 2016-06-16 DIAGNOSIS — Z9889 Other specified postprocedural states: Secondary | ICD-10-CM | POA: Insufficient documentation

## 2016-06-16 DIAGNOSIS — R002 Palpitations: Secondary | ICD-10-CM | POA: Insufficient documentation

## 2016-06-16 DIAGNOSIS — R0600 Dyspnea, unspecified: Secondary | ICD-10-CM | POA: Insufficient documentation

## 2016-07-06 IMAGING — MR MR LUMBAR SPINE W/O CM
5 series · 40 of 48 positions shown · non-contrast
Comparison: Lumbar MRI 05/09/2004

CLINICAL DATA: Back pain with weakness in both legs. Prior back
surgery.

EXAM:
MRI LUMBAR SPINE WITHOUT CONTRAST
TECHNIQUE: Multiplanar, multisequence MR imaging of the lumbar spine was
performed. No intravenous contrast was administered.

[Series 3: T2 · sagittal · 4.0mm · 0.81mm/px · 6 of 19 slices shown (1 of 2)]
[im 1/19]
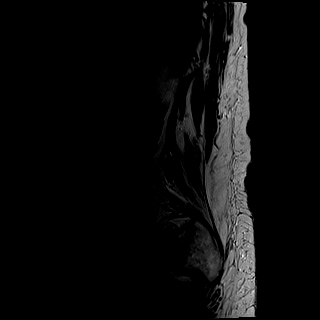
[im 4/19]
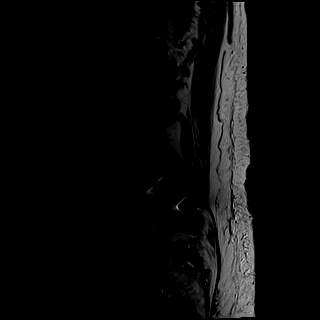
[im 8/19]
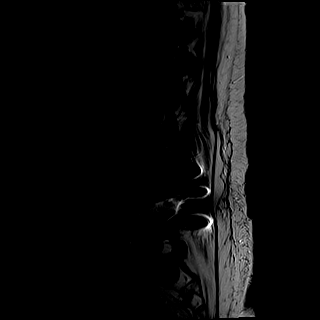
[im 11/19]
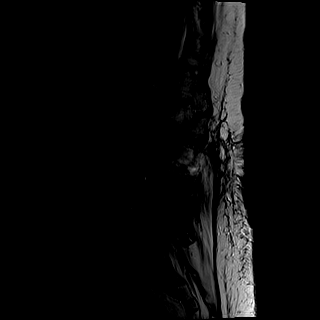
[im 15/19]
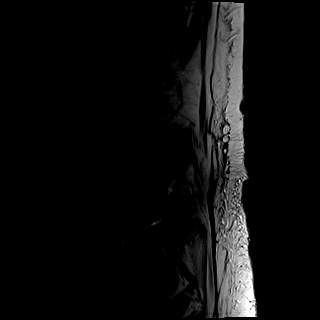
[im 19/19]
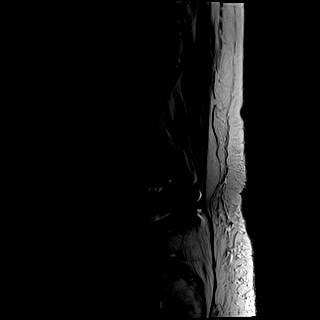

[Series 4: T1 · sagittal · 4.0mm · 0.81mm/px · 7 of 19 slices shown (1 of 2)]
[im 1/19]
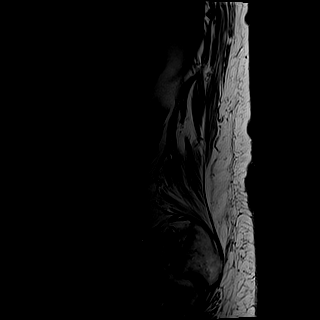
[im 4/19]
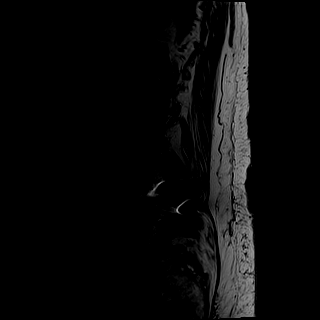
[im 7/19]
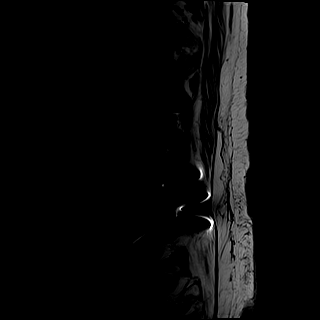
[im 10/19]
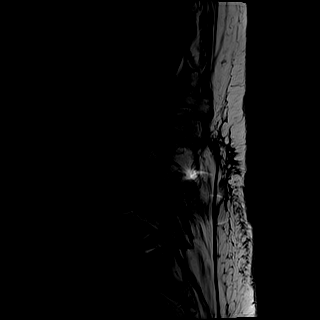
[im 13/19]
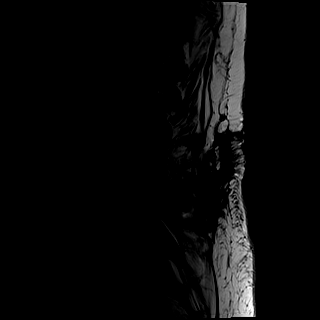
[im 16/19]
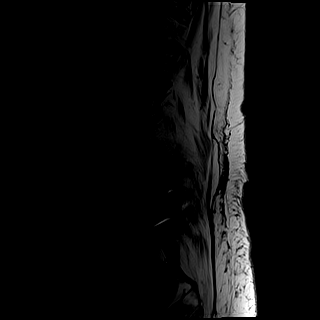
[im 19/19]
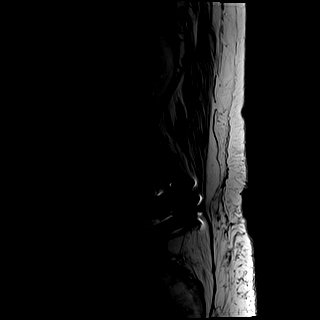

[Series 5: STIR · sagittal · 4.0mm · 1.02mm/px · 7 of 19 slices shown]
[im 1/19]
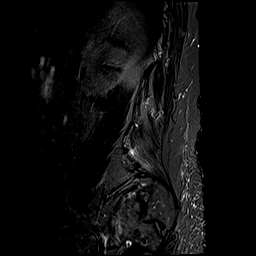
[im 4/19]
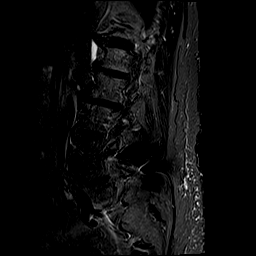
[im 7/19]
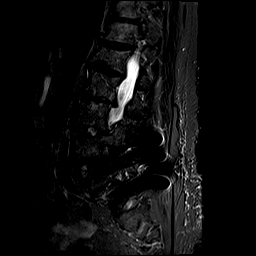
[im 10/19]
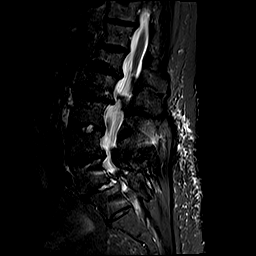
[im 13/19]
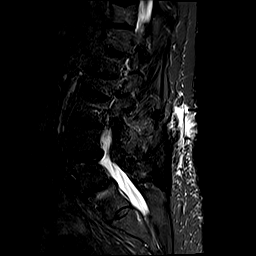
[im 16/19]
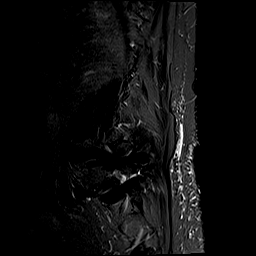
[im 19/19]
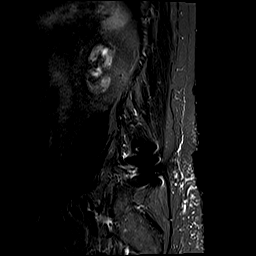

[Series 6: T2 · axial · 4.0mm · 0.78mm/px · z∈[+63,+269]mm · 12 of 38 slices shown (2 of 2)]
[im 1/38]
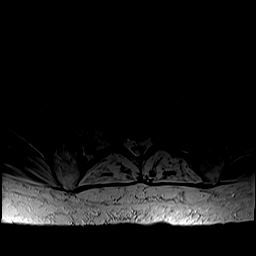
[im 3/38]
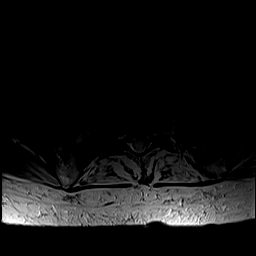
[im 6/38]
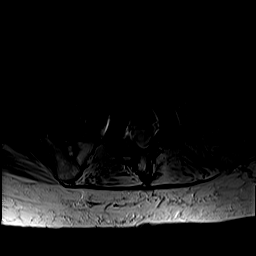
[im 9/38]
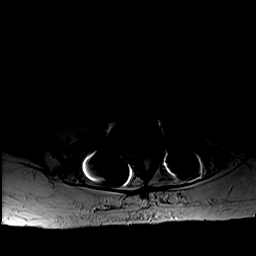
[im 12/38]
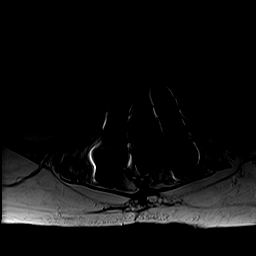
[im 15/38]
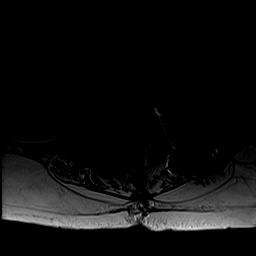
[im 18/38]
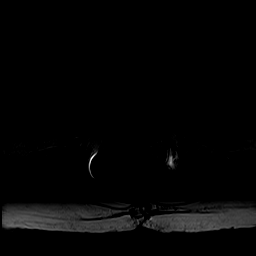
[im 20/38]
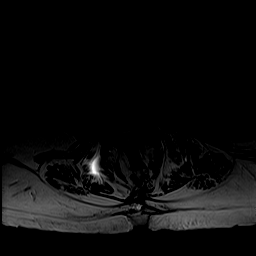
[im 23/38]
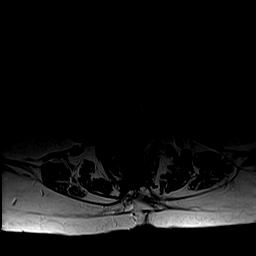
[im 26/38]
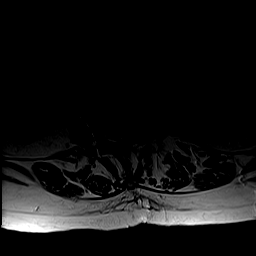
[im 32/38]
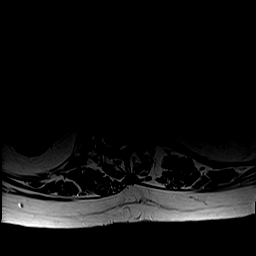
[im 38/38]
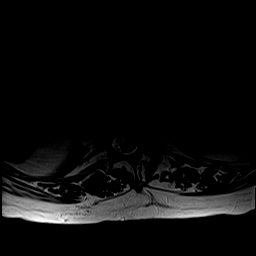

[Series 7: T1 · axial · 4.0mm · 0.39mm/px · z∈[+63,+269]mm · 8 of 38 slices shown (2 of 2)]
[im 1/38]
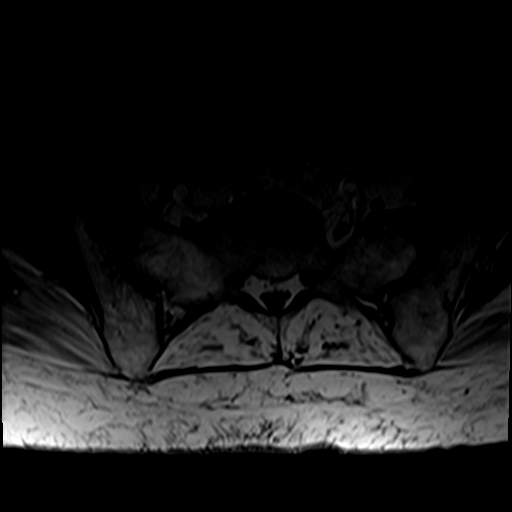
[im 6/38]
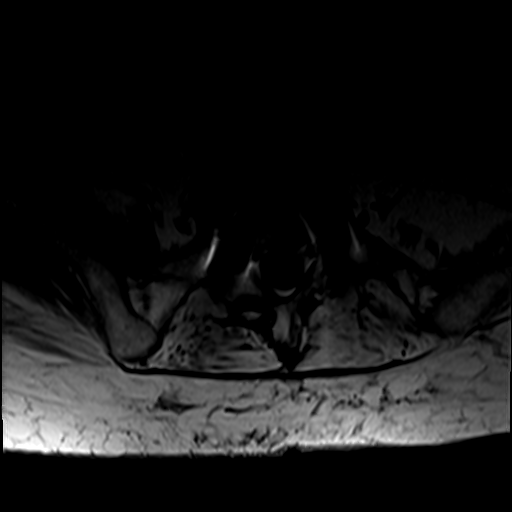
[im 12/38]
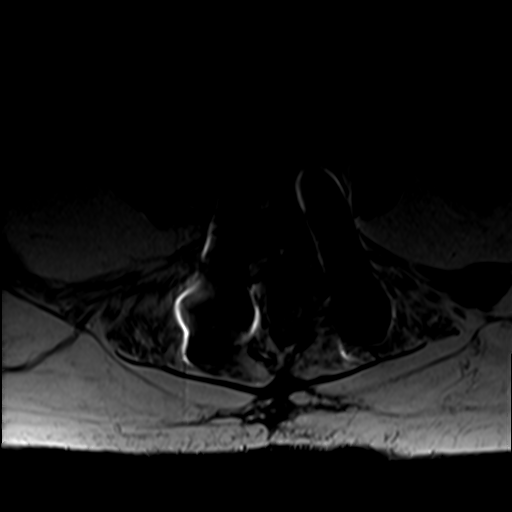
[im 18/38]
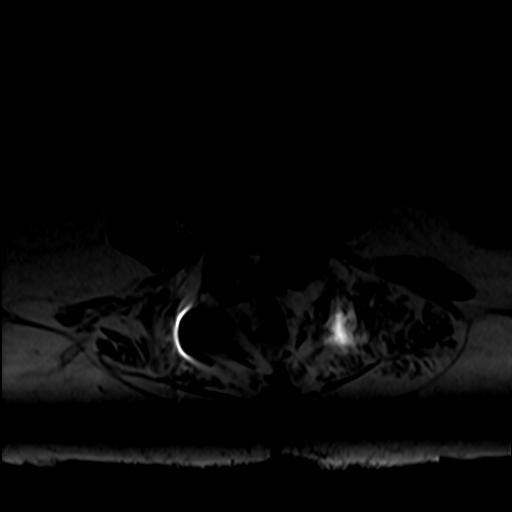
[im 20/38]
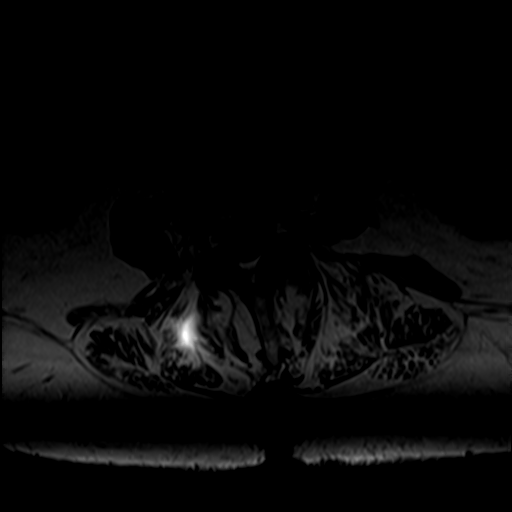
[im 26/38]
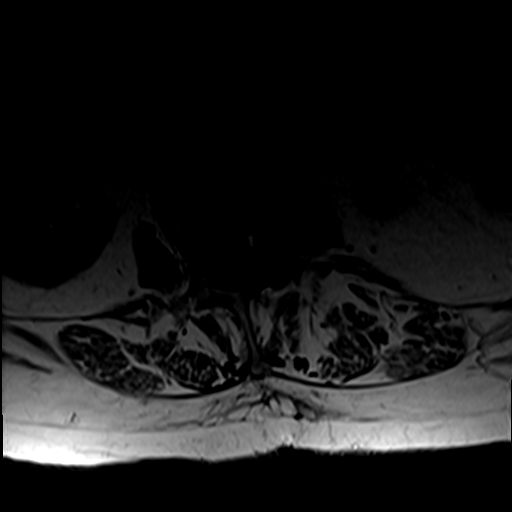
[im 32/38]
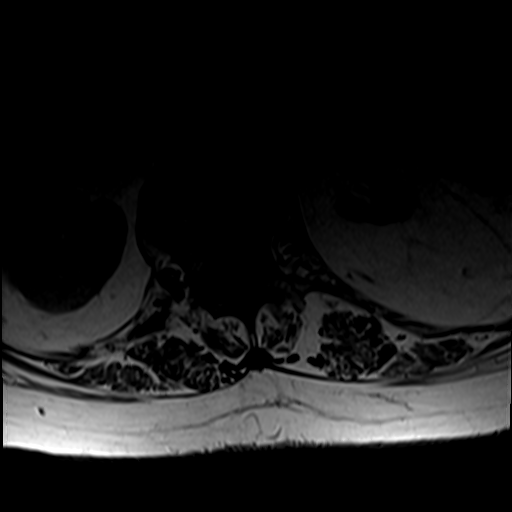
[im 38/38]
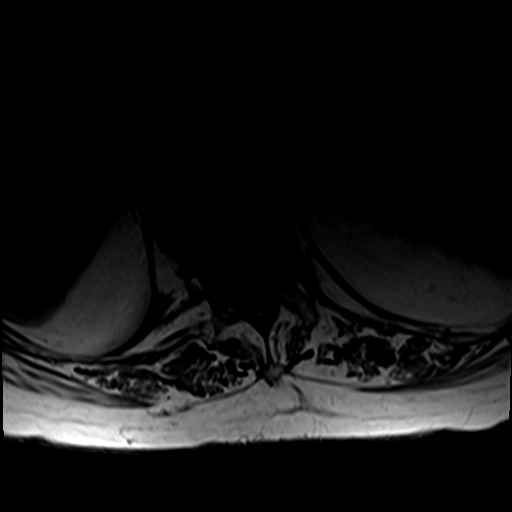

[40 of 48 positions shown; findings below may reference images not displayed]

FINDINGS: Dextroscoliosis again noted. S1 is considered a transitional segment
with prior fusion at L5-S1. Negative for fracture or mass. Conus
medullaris normal and terminates at mid L2.

T12-L1:  Mild degenerative change without stenosis

L1-2: Disc bulging and spurring on the left with left foraminal
narrowing unchanged

L2-3: Disc degeneration and spurring, left greater than right.
Marked left foraminal encroachment is unchanged. Mild spinal
stenosis unchanged

L3-4: Disc degeneration. Disc bulging and spurring. Bilateral facet
degeneration. Mild spinal stenosis unchanged.

L4-5: Diffuse disc bulging and spurring. Bilateral facet
hypertrophy. Mild spinal stenosis unchanged

L5-S1: Pedicle screw and interbody fusion. 5 mm anterior slip is
unchanged from the prior study. No significant spinal or foraminal
stenosis.
IMPRESSION: Mild to moderate left foraminal narrowing L1-2 unchanged. Severe
left foraminal encroachment L2-3 unchanged

Disc and facet degeneration causing mild spinal stenosis at L3-4 and
L4-5 unchanged

Grade 1 slip L5-S1 with prior fusion.  No significant stenosis.

## 2016-07-06 IMAGING — MR MR HIP*L* W/O CM
5 series · 34 of 40 positions shown · non-contrast
Comparison: Images from left hip injection 08/12/2014. Abdominal
pelvic CT 12/29/2011.

CLINICAL DATA: Low back and left hip pain with bilateral leg
numbness. No known injury or prior relevant surgery.

EXAM:
MR OF THE LEFT HIP WITHOUT CONTRAST
TECHNIQUE: Multiplanar, multisequence MR imaging was performed. No intravenous
contrast was administered.

[Series 3: T1 · coronal · 5.0mm · 1.56mm/px · 8 of 32 slices shown (1 of 2)]
[im 1/32]
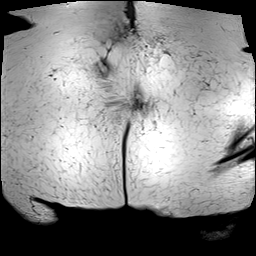
[im 5/32]
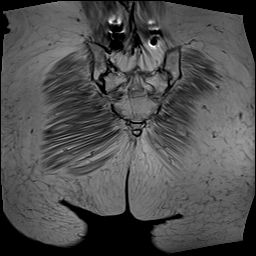
[im 9/32]
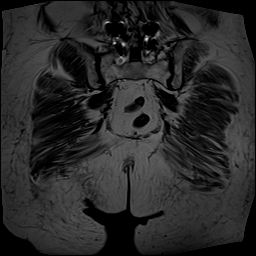
[im 14/32]
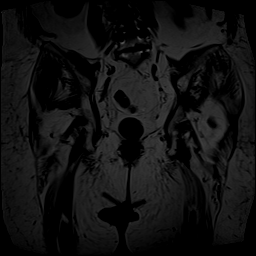
[im 18/32]
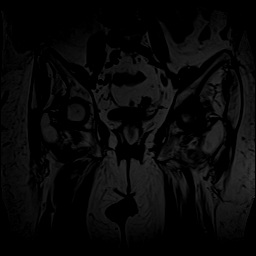
[im 23/32]
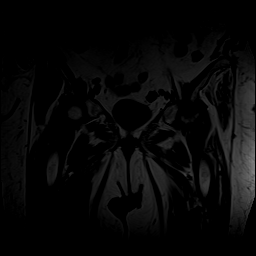
[im 27/32]
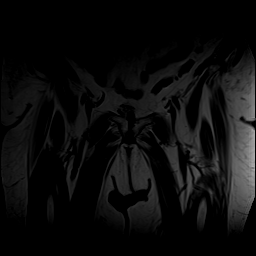
[im 32/32]
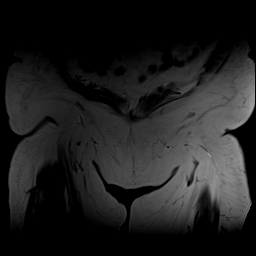

[Series 4: STIR · coronal · 5.0mm · 0.78mm/px · 2 of 32 slices shown]
[im 1/32]
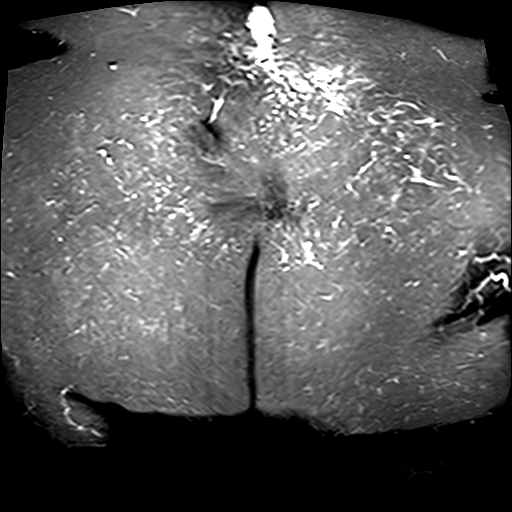
[im 5/32]
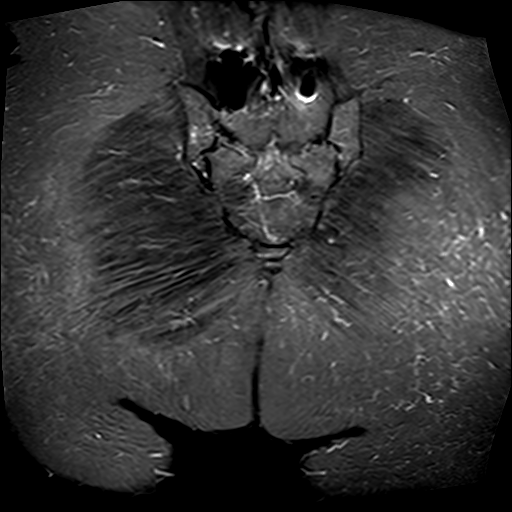

[Series 5: T1 · axial · 5.0mm · 1.19mm/px · z∈[-55,+182]mm · 9 of 39 slices shown (2 of 2)]
[im 1/39]
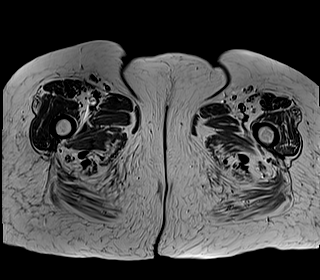
[im 5/39]
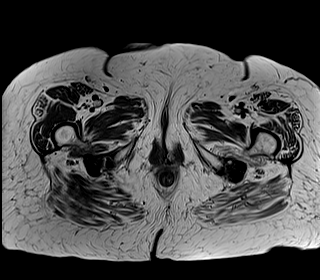
[im 10/39]
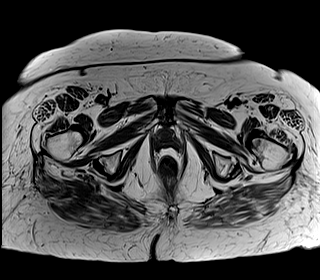
[im 15/39]
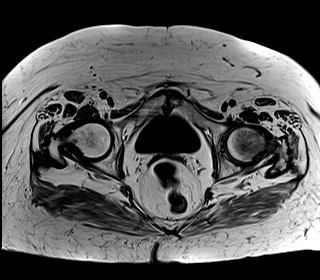
[im 20/39]
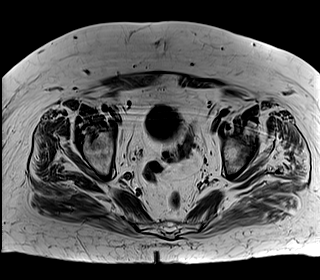
[im 24/39]
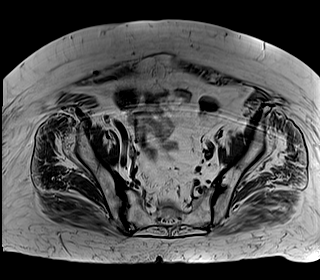
[im 29/39]
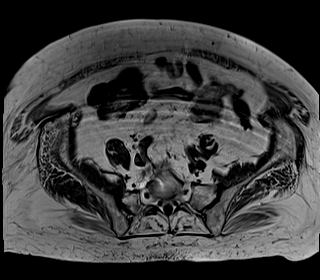
[im 34/39]
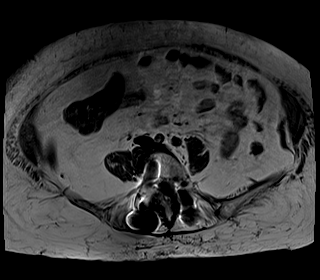
[im 39/39]
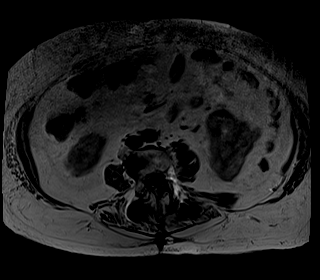

[Series 6: T2 fat-sat · axial · 5.0mm · 0.74mm/px · z∈[-55,+182]mm · 9 of 39 slices shown]
[im 1/39]
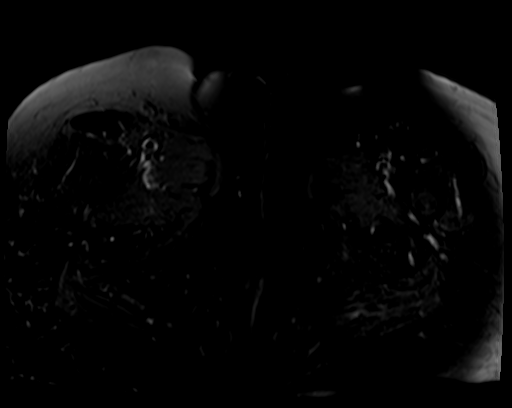
[im 5/39]
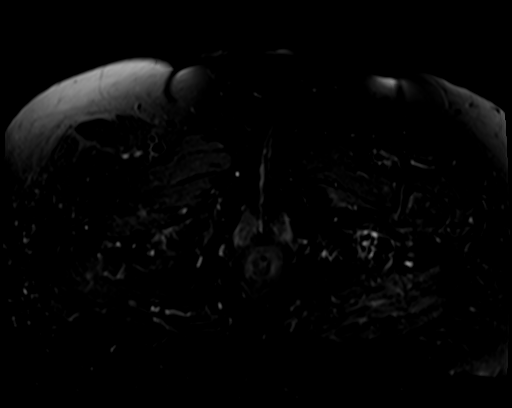
[im 10/39]
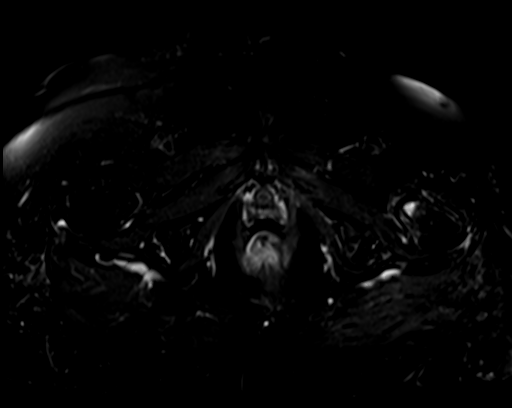
[im 15/39]
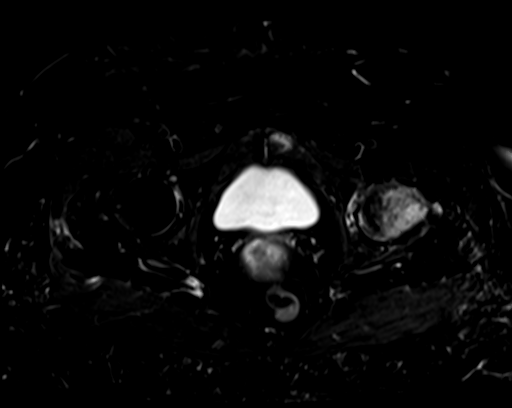
[im 20/39]
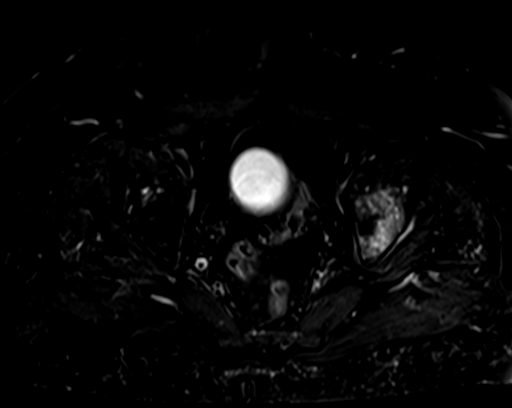
[im 24/39]
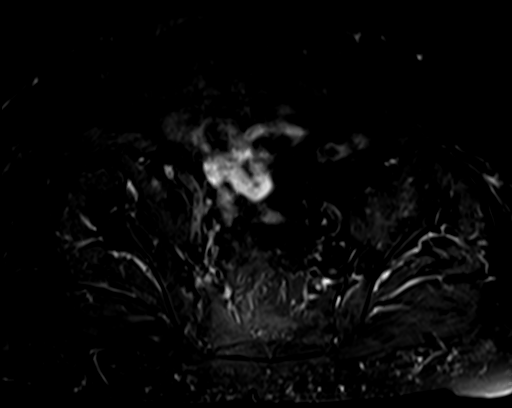
[im 29/39]
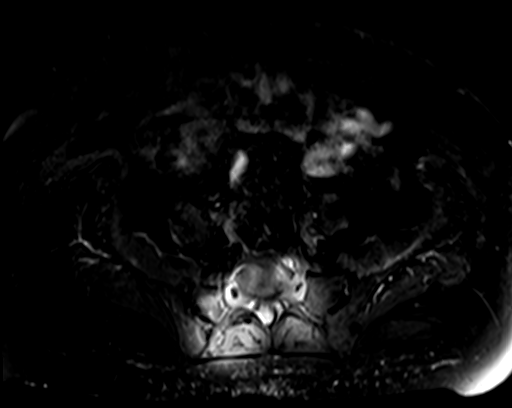
[im 34/39]
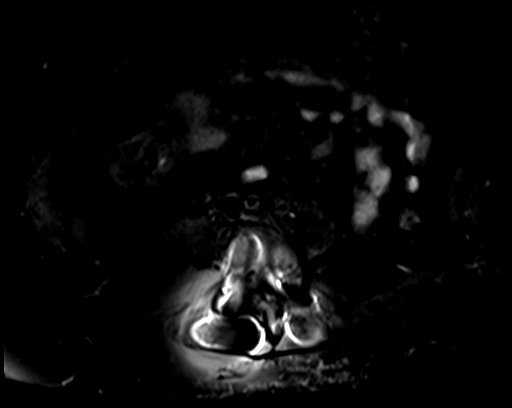
[im 39/39]
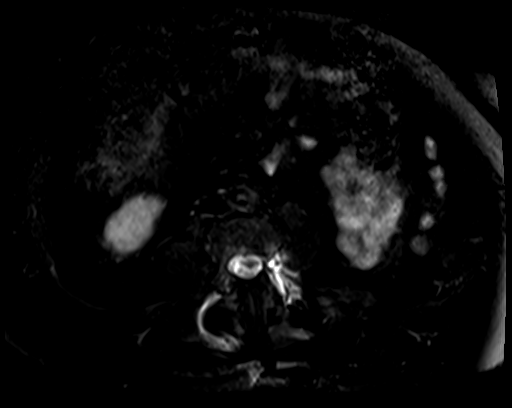

[Series 7: PD fat-sat · sagittal · 4.0mm · 0.36mm/px · 6 of 24 slices shown]
[im 1/24]
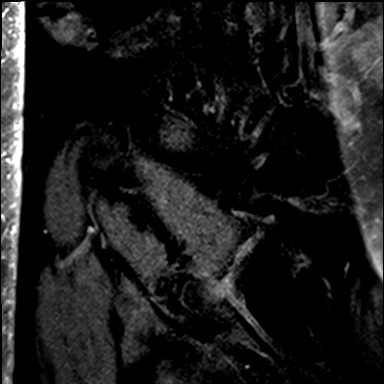
[im 5/24]
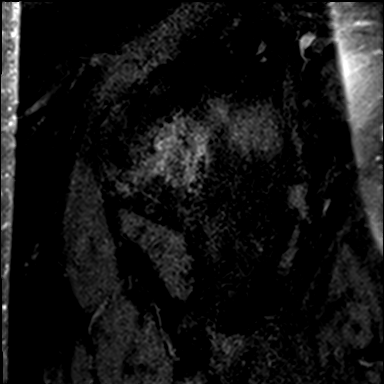
[im 10/24]
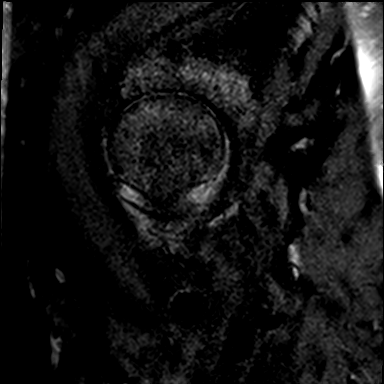
[im 14/24]
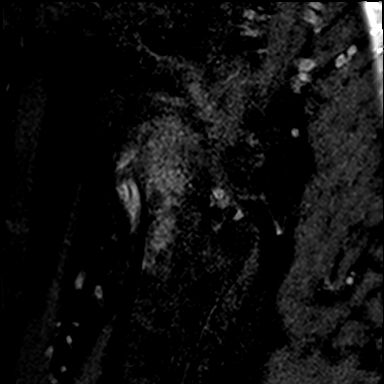
[im 19/24]
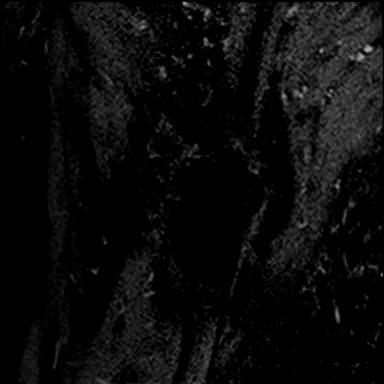
[im 24/24]
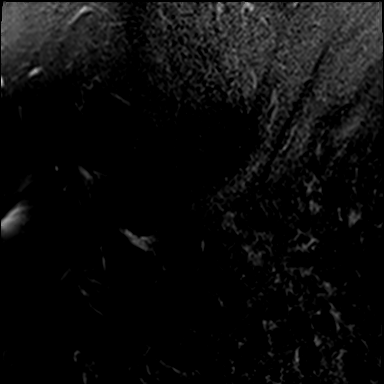

[34 of 40 positions shown; findings below may reference images not displayed]

FINDINGS: Bones: There is extensive marrow edema throughout the left femoral
head, femoral neck and acetabulum. There is new flattening of the
left femoral head with associated subchondral signal changes which
are nonspecific but could reflect avascular necrosis. The right
femoral head appears normal. There are mild sacroiliac degenerative
changes bilaterally and changes of mild osteitis pubis. Postsurgical
changes are present status post lower lumbar fusion.

Articular cartilage and labrum

Articular cartilage: Underlying degenerative changes of both hips
are noted. No focal chondral defect apparent.

Labrum: There is no gross labral tear or paralabral abnormality.

Joint or bursal effusion

Joint effusion: No significant hip joint effusion.

Bursae: No focal periarticular fluid collection.

Muscles and tendons

Muscles and tendons: Mild gluteus medius tendinosis bilaterally
without tear. There is asymmetric common hamstring tendinosis on the
left. The iliopsoas tendons appear normal bilaterally. The
piriformis muscles appear symmetric.

Other findings

Miscellaneous: Stable appearance status post hysterectomy. Sigmoid
diverticulosis noted.
IMPRESSION: 1. Progressive left hip arthropathic changes compared with prior
studies. This pattern is favored to reflect osteoarthritis.
Underlying avascular necrosis within the left femoral head is
difficult to exclude.
2. No significant hip joint effusion or focal periarticular fluid
collection.
3. Mild gluteus tendinosis bilaterally.

## 2016-07-12 DIAGNOSIS — I4891 Unspecified atrial fibrillation: Secondary | ICD-10-CM | POA: Insufficient documentation

## 2016-09-23 ENCOUNTER — Ambulatory Visit (INDEPENDENT_AMBULATORY_CARE_PROVIDER_SITE_OTHER): Payer: Medicare Other | Admitting: Podiatry

## 2016-09-23 ENCOUNTER — Encounter: Payer: Self-pay | Admitting: Podiatry

## 2016-09-23 DIAGNOSIS — L309 Dermatitis, unspecified: Secondary | ICD-10-CM | POA: Diagnosis not present

## 2016-09-23 MED ORDER — AMMONIUM LACTATE 12 % EX CREA: TOPICAL_CREAM | CUTANEOUS | 0 refills | Status: DC | PRN

## 2016-09-23 MED ORDER — CLOTRIMAZOLE-BETAMETHASONE 1-0.05 % EX CREA: 1.0000 "application " | TOPICAL_CREAM | Freq: Two times a day (BID) | CUTANEOUS | 0 refills | Status: DC

## 2016-09-23 NOTE — Addendum Note (Signed)
Addended byDeidre Ala, Sabrea Sankey L on: 09/23/2016 01:24 PM   Modules accepted: Orders

## 2016-09-23 NOTE — Progress Notes (Signed)
This patient presents the office with chief complaint of a rash on the top of her left foot.  She says that she developed this rash 1 day ago.  She says the rash causes her to itch and she uses her foot to help bandage the site. During sleep. She also says that she is starting to develop the same skin lesion on her upper left leg.  She says she has provided no self treatment nor sought any professional help.  She presents the office today for an evaluation and treatment of this rash on her left foot.    Podiatric Exam: Vascular: dorsalis pedis and posterior tibial pulses are palpable bilateral. Capillary return is immediate. Temperature gradient is WNL. Skin turgor WNL  Sensorium: Normal Semmes Weinstein monofilament test. Normal tactile sensation bilaterally. Nail Exam: Pt has thick disfigured discolored nails with subungual debris noted bilateral entire nail hallux through fifth toenails Ulcer Exam: There is no evidence of ulcer or pre-ulcerative changes or infection. Orthopedic Exam: Muscle tone and strength are WNL. No limitations in general ROM. No crepitus or effusions noted. Foot type and digits show no abnormalities. Bony prominences are unremarkable. Skin: No Porokeratosis. No infection or ulcer. There are multiple small vessicular lesions of dorsum of 2 MPJ  Left foot.  No evidence of any drainage or redness noted  Dermatitis left foot   ROV  prescribed Lotrisone for this patient to apply to her foot.  RTC prn.    Gardiner Barefoot DPM

## 2016-09-24 ENCOUNTER — Encounter: Payer: Self-pay | Admitting: Podiatry

## 2016-10-11 ENCOUNTER — Ambulatory Visit
Admission: RE | Admit: 2016-10-11 | Discharge: 2016-10-11 | Disposition: A | Discharge status: Home / Self Care | Payer: Medicare Other | Source: Ambulatory Visit | Attending: Nephrology | Admitting: Nephrology

## 2016-10-11 ENCOUNTER — Other Ambulatory Visit: Payer: Self-pay | Admitting: Nephrology

## 2016-10-11 DIAGNOSIS — J439 Emphysema, unspecified: Secondary | ICD-10-CM | POA: Insufficient documentation

## 2016-10-11 DIAGNOSIS — R0989 Other specified symptoms and signs involving the circulatory and respiratory systems: Secondary | ICD-10-CM

## 2016-10-11 DIAGNOSIS — R059 Cough, unspecified: Secondary | ICD-10-CM

## 2016-10-11 DIAGNOSIS — R05 Cough: Secondary | ICD-10-CM

## 2017-01-17 ENCOUNTER — Other Ambulatory Visit: Payer: Self-pay | Admitting: Family Medicine

## 2017-01-17 DIAGNOSIS — Z1231 Encounter for screening mammogram for malignant neoplasm of breast: Secondary | ICD-10-CM

## 2017-02-18 ENCOUNTER — Ambulatory Visit
Admission: RE | Admit: 2017-02-18 | Discharge: 2017-02-18 | Disposition: A | Discharge status: Home / Self Care | Payer: Medicare Other | Source: Ambulatory Visit | Attending: Family Medicine | Admitting: Family Medicine

## 2017-02-18 DIAGNOSIS — Z1231 Encounter for screening mammogram for malignant neoplasm of breast: Secondary | ICD-10-CM | POA: Diagnosis present

## 2017-04-26 DIAGNOSIS — M419 Scoliosis, unspecified: Secondary | ICD-10-CM | POA: Insufficient documentation

## 2017-04-27 ENCOUNTER — Other Ambulatory Visit (HOSPITAL_COMMUNITY): Payer: Self-pay | Admitting: Neurosurgery

## 2017-04-27 ENCOUNTER — Other Ambulatory Visit: Payer: Self-pay | Admitting: Neurosurgery

## 2017-04-27 DIAGNOSIS — M4150 Other secondary scoliosis, site unspecified: Principal | ICD-10-CM

## 2017-05-06 ENCOUNTER — Ambulatory Visit
Admission: RE | Admit: 2017-05-06 | Discharge: 2017-05-06 | Disposition: A | Discharge status: Home / Self Care | Payer: Medicare Other | Source: Ambulatory Visit | Attending: Neurosurgery | Admitting: Neurosurgery

## 2017-05-06 DIAGNOSIS — M4150 Other secondary scoliosis, site unspecified: Secondary | ICD-10-CM | POA: Insufficient documentation

## 2017-05-06 DIAGNOSIS — M5136 Other intervertebral disc degeneration, lumbar region: Secondary | ICD-10-CM | POA: Insufficient documentation

## 2017-05-06 DIAGNOSIS — M48061 Spinal stenosis, lumbar region without neurogenic claudication: Secondary | ICD-10-CM | POA: Insufficient documentation

## 2017-07-07 ENCOUNTER — Encounter: Payer: Self-pay | Admitting: *Deleted

## 2017-07-08 ENCOUNTER — Ambulatory Visit: Payer: Medicare Other | Admitting: Anesthesiology

## 2017-07-08 ENCOUNTER — Encounter
Admission: RE | Disposition: A | Discharge status: Home / Self Care | Payer: Self-pay | Source: Ambulatory Visit | Attending: Unknown Physician Specialty

## 2017-07-08 ENCOUNTER — Other Ambulatory Visit: Payer: Self-pay

## 2017-07-08 ENCOUNTER — Ambulatory Visit
Admission: RE | Admit: 2017-07-08 | Discharge: 2017-07-08 | Disposition: A | Discharge status: Home / Self Care | Payer: Medicare Other | Source: Ambulatory Visit | Attending: Unknown Physician Specialty | Admitting: Unknown Physician Specialty

## 2017-07-08 DIAGNOSIS — K5730 Diverticulosis of large intestine without perforation or abscess without bleeding: Secondary | ICD-10-CM | POA: Insufficient documentation

## 2017-07-08 DIAGNOSIS — K219 Gastro-esophageal reflux disease without esophagitis: Secondary | ICD-10-CM | POA: Diagnosis not present

## 2017-07-08 DIAGNOSIS — Z96652 Presence of left artificial knee joint: Secondary | ICD-10-CM | POA: Insufficient documentation

## 2017-07-08 DIAGNOSIS — N189 Chronic kidney disease, unspecified: Secondary | ICD-10-CM | POA: Diagnosis not present

## 2017-07-08 DIAGNOSIS — K317 Polyp of stomach and duodenum: Secondary | ICD-10-CM | POA: Insufficient documentation

## 2017-07-08 DIAGNOSIS — K222 Esophageal obstruction: Secondary | ICD-10-CM | POA: Insufficient documentation

## 2017-07-08 DIAGNOSIS — Z96612 Presence of left artificial shoulder joint: Secondary | ICD-10-CM | POA: Diagnosis not present

## 2017-07-08 DIAGNOSIS — I129 Hypertensive chronic kidney disease with stage 1 through stage 4 chronic kidney disease, or unspecified chronic kidney disease: Secondary | ICD-10-CM | POA: Insufficient documentation

## 2017-07-08 DIAGNOSIS — Z888 Allergy status to other drugs, medicaments and biological substances status: Secondary | ICD-10-CM | POA: Insufficient documentation

## 2017-07-08 DIAGNOSIS — I739 Peripheral vascular disease, unspecified: Secondary | ICD-10-CM | POA: Diagnosis not present

## 2017-07-08 DIAGNOSIS — K2970 Gastritis, unspecified, without bleeding: Secondary | ICD-10-CM | POA: Insufficient documentation

## 2017-07-08 DIAGNOSIS — E785 Hyperlipidemia, unspecified: Secondary | ICD-10-CM | POA: Insufficient documentation

## 2017-07-08 DIAGNOSIS — D122 Benign neoplasm of ascending colon: Secondary | ICD-10-CM | POA: Diagnosis not present

## 2017-07-08 DIAGNOSIS — Z96642 Presence of left artificial hip joint: Secondary | ICD-10-CM | POA: Insufficient documentation

## 2017-07-08 DIAGNOSIS — Z6837 Body mass index (BMI) 37.0-37.9, adult: Secondary | ICD-10-CM | POA: Diagnosis not present

## 2017-07-08 DIAGNOSIS — E669 Obesity, unspecified: Secondary | ICD-10-CM | POA: Diagnosis not present

## 2017-07-08 DIAGNOSIS — Z880 Allergy status to penicillin: Secondary | ICD-10-CM | POA: Insufficient documentation

## 2017-07-08 DIAGNOSIS — G40909 Epilepsy, unspecified, not intractable, without status epilepticus: Secondary | ICD-10-CM | POA: Diagnosis not present

## 2017-07-08 DIAGNOSIS — Z7901 Long term (current) use of anticoagulants: Secondary | ICD-10-CM | POA: Insufficient documentation

## 2017-07-08 DIAGNOSIS — Z79899 Other long term (current) drug therapy: Secondary | ICD-10-CM | POA: Insufficient documentation

## 2017-07-08 DIAGNOSIS — Z882 Allergy status to sulfonamides status: Secondary | ICD-10-CM | POA: Insufficient documentation

## 2017-07-08 DIAGNOSIS — Z7982 Long term (current) use of aspirin: Secondary | ICD-10-CM | POA: Insufficient documentation

## 2017-07-08 DIAGNOSIS — Z91041 Radiographic dye allergy status: Secondary | ICD-10-CM | POA: Insufficient documentation

## 2017-07-08 DIAGNOSIS — Z885 Allergy status to narcotic agent status: Secondary | ICD-10-CM | POA: Insufficient documentation

## 2017-07-08 DIAGNOSIS — K640 First degree hemorrhoids: Secondary | ICD-10-CM | POA: Diagnosis not present

## 2017-07-08 DIAGNOSIS — Z8719 Personal history of other diseases of the digestive system: Secondary | ICD-10-CM | POA: Diagnosis not present

## 2017-07-08 DIAGNOSIS — M810 Age-related osteoporosis without current pathological fracture: Secondary | ICD-10-CM | POA: Diagnosis not present

## 2017-07-08 DIAGNOSIS — M1990 Unspecified osteoarthritis, unspecified site: Secondary | ICD-10-CM | POA: Diagnosis not present

## 2017-07-08 DIAGNOSIS — Z1211 Encounter for screening for malignant neoplasm of colon: Secondary | ICD-10-CM | POA: Insufficient documentation

## 2017-07-08 DIAGNOSIS — Z85828 Personal history of other malignant neoplasm of skin: Secondary | ICD-10-CM | POA: Insufficient documentation

## 2017-07-08 DIAGNOSIS — Z800 Family history of malignant neoplasm of digestive organs: Secondary | ICD-10-CM | POA: Diagnosis not present

## 2017-07-08 HISTORY — PX: ESOPHAGOGASTRODUODENOSCOPY (EGD) WITH PROPOFOL: SHX5813

## 2017-07-08 HISTORY — DX: Essential (primary) hypertension: I10

## 2017-07-08 HISTORY — DX: Varicose veins of unspecified lower extremity with inflammation: I83.10

## 2017-07-08 HISTORY — DX: Epilepsy, unspecified, not intractable, without status epilepticus: G40.909

## 2017-07-08 HISTORY — DX: Endocarditis, valve unspecified: I38

## 2017-07-08 HISTORY — DX: Gastro-esophageal reflux disease without esophagitis: K21.9

## 2017-07-08 HISTORY — DX: Hyperlipidemia, unspecified: E78.5

## 2017-07-08 HISTORY — DX: Age-related osteoporosis without current pathological fracture: M81.0

## 2017-07-08 HISTORY — PX: COLONOSCOPY WITH PROPOFOL: SHX5780

## 2017-07-08 HISTORY — DX: Personal history of other diseases of the musculoskeletal system and connective tissue: Z87.39

## 2017-07-08 HISTORY — DX: Allergic rhinitis, unspecified: J30.9

## 2017-07-08 HISTORY — DX: Obesity, unspecified: E66.9

## 2017-07-08 SURGERY — COLONOSCOPY WITH PROPOFOL
Anesthesia: General

## 2017-07-08 MED ORDER — VANCOMYCIN HCL IN DEXTROSE 1-5 GM/200ML-% IV SOLN
INTRAVENOUS | Status: AC
  Filled 2017-07-08: qty 200

## 2017-07-08 MED ORDER — VANCOMYCIN HCL 1 G IV SOLR: 1000.0000 mg | INTRAVENOUS | Status: DC

## 2017-07-08 MED ORDER — PROPOFOL 500 MG/50ML IV EMUL
INTRAVENOUS | Status: DC | PRN
  Administered 2017-07-08: 75 ug/kg/min via INTRAVENOUS

## 2017-07-08 MED ORDER — MIDAZOLAM HCL 2 MG/2ML IJ SOLN
INTRAMUSCULAR | Status: AC
  Filled 2017-07-08: qty 2

## 2017-07-08 MED ORDER — LIDOCAINE HCL (PF) 2 % IJ SOLN
INTRAMUSCULAR | Status: DC | PRN
  Administered 2017-07-08: 100 mg

## 2017-07-08 MED ORDER — PROPOFOL 10 MG/ML IV BOLUS
INTRAVENOUS | Status: DC | PRN
  Administered 2017-07-08 (×2): 10 mg via INTRAVENOUS
  Administered 2017-07-08: 30 mg via INTRAVENOUS

## 2017-07-08 MED ORDER — GENTAMICIN SULFATE 40 MG/ML IJ SOLN
100.0000 mg | Freq: Once | INTRAVENOUS | Status: AC
  Administered 2017-07-08: 100 mg via INTRAVENOUS
  Filled 2017-07-08: qty 2.5

## 2017-07-08 MED ORDER — GENTAMICIN IN SALINE 1-0.9 MG/ML-% IV SOLN: 100.0000 mg | Freq: Once | INTRAVENOUS | Status: DC

## 2017-07-08 MED ORDER — FENTANYL CITRATE (PF) 100 MCG/2ML IJ SOLN
INTRAMUSCULAR | Status: DC | PRN
  Administered 2017-07-08 (×2): 50 ug via INTRAVENOUS

## 2017-07-08 MED ORDER — GENTAMICIN IN SALINE 1-0.9 MG/ML-% IV SOLN
100.0000 mg | Freq: Once | INTRAVENOUS | Status: DC
  Filled 2017-07-08: qty 100

## 2017-07-08 MED ORDER — PROPOFOL 500 MG/50ML IV EMUL
INTRAVENOUS | Status: AC
  Filled 2017-07-08: qty 50

## 2017-07-08 MED ORDER — VANCOMYCIN HCL IN DEXTROSE 1-5 GM/200ML-% IV SOLN
1000.0000 mg | Freq: Once | INTRAVENOUS | Status: AC
  Administered 2017-07-08: 1000 mg via INTRAVENOUS

## 2017-07-08 MED ORDER — GLYCOPYRROLATE 0.2 MG/ML IJ SOLN
INTRAMUSCULAR | Status: AC
  Filled 2017-07-08: qty 1

## 2017-07-08 MED ORDER — SODIUM CHLORIDE 0.9 % IV SOLN
INTRAVENOUS | Status: DC
  Administered 2017-07-08: 08:00:00 via INTRAVENOUS

## 2017-07-08 MED ORDER — FENTANYL CITRATE (PF) 100 MCG/2ML IJ SOLN
INTRAMUSCULAR | Status: AC
  Filled 2017-07-08: qty 2

## 2017-07-08 MED ORDER — LIDOCAINE HCL (PF) 2 % IJ SOLN
INTRAMUSCULAR | Status: AC
  Filled 2017-07-08: qty 10

## 2017-07-08 MED ORDER — MIDAZOLAM HCL 5 MG/5ML IJ SOLN
INTRAMUSCULAR | Status: DC | PRN
  Administered 2017-07-08: 2 mg via INTRAVENOUS

## 2017-07-08 NOTE — Op Note (Signed)
[facility] Gastroenterology Patient Name: Alisha Mullen Procedure Date: 07/08/2017 8:37 AM MRN: [id number] Account #: [id number] Date of Birth: 26-Oct-1934 Admit Type: Outpatient Age: 83 Room: [facility] ENDO ROOM 3 Gender: Female Note Status: Finalized Procedure:            Colonoscopy Indications:          Screening in patient at increased risk: Family history                        of 1st-degree relative with colorectal cancer Providers:            Manya Silvas, MD Medicines:            Propofol per Anesthesia Complications:        No immediate complications. Procedure:            Pre-Anesthesia Assessment:                       - After reviewing the risks and benefits, the patient                        was deemed in satisfactory condition to undergo the                        procedure.                       After obtaining informed consent, the colonoscope was                        passed under direct vision. Throughout the procedure,                        the patient's blood pressure, pulse, and oxygen                        saturations were monitored continuously. The                        Colonoscope was introduced through the anus and                        advanced to the the cecum, identified by appendiceal                        orifice and ileocecal valve. The colonoscopy was                        performed without difficulty. The patient tolerated the                        procedure well. The quality of the bowel preparation                        was excellent. Findings:      Multiple small-mouthed diverticula were found in the sigmoid colon,       descending colon and transverse colon.      A diminutive polyp was found in the ascending colon. The polyp was       sessile. The polyp was removed with a jumbo cold forceps. Resection and  retrieval were complete.      Internal hemorrhoids were found during endoscopy. The hemorrhoids were    small and Grade I (internal hemorrhoids that do not prolapse).      The exam was otherwise without abnormality. Impression:           - Diverticulosis in the sigmoid colon, in the                        descending colon and in the transverse colon.                       - One diminutive polyp in the ascending colon, removed                        with a jumbo cold forceps. Resected and retrieved.                       - Internal hemorrhoids.                       - The examination was otherwise normal. Recommendation:       - Await pathology results. Manya Silvas, MD 07/08/2017 9:40:37 AM This report has been signed electronically. Number of Addenda: 0 Note Initiated On: 07/08/2017 8:37 AM Scope Withdrawal Time: 0 hours 7 minutes 11 seconds  Total Procedure Duration: 0 hours 12 minutes 32 seconds       [facility]

## 2017-07-08 NOTE — Op Note (Signed)
[facility] Gastroenterology Patient Name: Alisha Mullen Procedure Date: 07/08/2017 8:39 AM MRN: [id number] Account #: [id number] Date of Birth: 03/26/1934 Admit Type: Outpatient Age: 83 Room: [facility] ENDO ROOM 3 Gender: Female Note Status: Finalized Procedure:            Upper GI endoscopy Indications:          Dysphagia Providers:            Manya Silvas, MD Referring MD:         Caprice Renshaw MD (Referring MD) Medicines:            Propofol per Anesthesia Complications:        No immediate complications. Procedure:            Pre-Anesthesia Assessment:                       - After reviewing the risks and benefits, the patient                        was deemed in satisfactory condition to undergo the                        procedure.                       After obtaining informed consent, the endoscope was                        passed under direct vision. Throughout the procedure,                        the patient's blood pressure, pulse, and oxygen                        saturations were monitored continuously. The Endoscope                        was introduced through the mouth, and advanced to the                        second part of duodenum. The upper GI endoscopy was                        accomplished without difficulty. The patient tolerated                        the procedure well. Findings:      The esophagus was snug and difficult and the stomach also was difficult       due to spasm. but I was able to advance to the second portion of the       duodenum. GEJ at 40cm.      Diffuse mildly erythematous mucosa without bleeding was found in the       gastric body and in the gastric antrum. Biopsies were taken with a cold       forceps for histology. Biopsies were taken with a cold forceps for       Helicobacter pylori testing. There was significant spasm of the stomach       and it was difficult to inflate the stomach due to body habitus.    Scattered small polyps noted  in duodenum.      The examined duodenum was normal. Impression:           - Mild Schatzki ring.                       - Erythematous mucosa in the gastric body and antrum.                        Biopsied.                       - Normal examined duodenum. Recommendation:       - Await pathology results.                       - Perform a colonoscopy as previously scheduled. Manya Silvas, MD 07/08/2017 9:22:04 AM This report has been signed electronically. Number of Addenda: 0 Note Initiated On: 07/08/2017 8:39 AM      [facility]

## 2017-07-08 NOTE — Transfer of Care (Signed)
Immediate Anesthesia Transfer of Care Note  Patient: Alisha Mullen  Procedure(s) Performed: COLONOSCOPY WITH PROPOFOL (N/A ) ESOPHAGOGASTRODUODENOSCOPY (EGD) WITH PROPOFOL (N/A )  Patient Location: PACU  Anesthesia Type:General  Level of Consciousness: sedated  Airway & Oxygen Therapy: Patient Spontanous Breathing and Patient connected to nasal cannula oxygen  Post-op Assessment: Report given to RN and Post -op Vital signs reviewed and stable  Post vital signs: Reviewed and stable  Last Vitals:  Vitals Value Taken Time  BP    Temp 35.7 C 07/08/2017  9:40 AM  Pulse    Resp    SpO2      Last Pain:  Vitals:   07/08/17 0940  TempSrc: Tympanic  PainSc: Asleep         Complications: No apparent anesthesia complications

## 2017-07-08 NOTE — Anesthesia Preprocedure Evaluation (Signed)
Anesthesia Evaluation  Patient identified by MRN, date of birth, ID band Patient awake    Reviewed: Allergy & Precautions, H&P , NPO status , Patient's Chart, lab work & pertinent test results, reviewed documented beta blocker date and time   Airway Mallampati: II   Neck ROM: full    Dental  (+) Teeth Intact   Pulmonary neg pulmonary ROS,    Pulmonary exam normal        Cardiovascular Exercise Tolerance: Poor hypertension, On Medications + Peripheral Vascular Disease  negative cardio ROS Normal cardiovascular exam Rhythm:regular Rate:Normal     Neuro/Psych Seizures -,   Neuromuscular disease negative neurological ROS  negative psych ROS   GI/Hepatic negative GI ROS, Neg liver ROS, GERD  Medicated,  Endo/Other  negative endocrine ROS  Renal/GU Renal diseasenegative Renal ROS  negative genitourinary   Musculoskeletal   Abdominal   Peds  Hematology negative hematology ROS (+)   Anesthesia Other Findings Past Medical History: No date: Allergic rhinitis No date: Arthritis No date: Bruises easily No date: Cancer ([place])     Comment:  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 No date: Chronic kidney disease     Comment:  decreased rt kidney function - followed by Dr.kolluru -               [place] No date: Diverticulosis No date: Epilepsy ([place]) No date: GERD (gastroesophageal reflux disease) No date: H/O degenerative disc disease No date: Hyperlipidemia No date: Hypertension No date: Obesity No date: Seizures ([place])     Comment:  last seizure 10 yrs ago  "controlled epilepsy" No date: Senile osteoporosis No date: Valvular heart disease No date: Varicose veins of lower extremities with inflammation Past Surgical History: No date: ABDOMINAL HYSTERECTOMY No date: APPENDECTOMY 2011: BACK SURGERY No date: BREAST EXCISIONAL BIOPSY; Right     Comment:  surgical bx neg No date: BREAST EXCISIONAL BIOPSY; Right  Comment:  surgical neg 1970 / 1980: BREAST SURGERY     Comment:  benign lumpectomy No date: CARPAL TUNNEL RELEASE No date: CATARACT EXTRACTION BILATERAL W/ ANTERIOR VITRECTOMY;  Bilateral No date: CHOLECYSTECTOMY No date: COLONOSCOPY 2005: FOOT SURGERY; Right     Comment:  both right and left feet No date: JOINT REPLACEMENT     Comment:  total L shoulder / total L knee 2001: REPLACEMENT TOTAL KNEE; Left No date: SHOULDER SURGERY; Right No date: TONSILLECTOMY 05/30/2015: TOTAL HIP ARTHROPLASTY; Left     Comment:  Procedure: LEFT TOTAL HIP ARTHROPLASTY ANTERIOR               APPROACH;  Surgeon: Mcarthur Rossetti, MD;                Location: [facility] ORS;  Service: Orthopedics;  Laterality:               Left; 2007: TOTAL SHOULDER REPLACEMENT; Left   Reproductive/Obstetrics negative OB ROS                             Anesthesia Physical Anesthesia Plan  ASA: III  Anesthesia Plan: General   Post-op Pain Management:    Induction:   PONV Risk Score and Plan:   Airway Management Planned:   Additional Equipment:   Intra-op Plan:   Post-operative Plan:   Informed Consent: I have reviewed the patients History and Physical, chart, labs and discussed the procedure including the risks, benefits and alternatives for the proposed anesthesia with the patient  or authorized representative who has indicated his/her understanding and acceptance.   Dental Advisory Given  Plan Discussed with: CRNA  Anesthesia Plan Comments:         Anesthesia Quick Evaluation

## 2017-07-08 NOTE — Anesthesia Post-op Follow-up Note (Signed)
Anesthesia QCDR form completed.        

## 2017-07-08 NOTE — Anesthesia Postprocedure Evaluation (Signed)
Anesthesia Post Note  Patient: ROMANITA FAGER  Procedure(s) Performed: COLONOSCOPY WITH PROPOFOL (N/A ) ESOPHAGOGASTRODUODENOSCOPY (EGD) WITH PROPOFOL (N/A )  Patient location during evaluation: PACU Anesthesia Type: General Level of consciousness: awake and alert Pain management: pain level controlled Vital Signs Assessment: post-procedure vital signs reviewed and stable Respiratory status: spontaneous breathing, nonlabored ventilation, respiratory function stable and patient connected to nasal cannula oxygen Cardiovascular status: blood pressure returned to baseline and stable Postop Assessment: no apparent nausea or vomiting Anesthetic complications: no     Last Vitals:  Vitals:   07/08/17 0950 07/08/17 1000  BP: 140/70 117/88  Pulse:    Resp: 19 15  Temp:    SpO2: 100% 100%    Last Pain:  Vitals:   07/08/17 1000  TempSrc:   PainSc: 0-No pain                 Molli Barrows

## 2017-07-11 ENCOUNTER — Encounter: Payer: Self-pay | Admitting: Unknown Physician Specialty

## 2017-07-11 LAB — SURGICAL PATHOLOGY

## 2017-11-14 ENCOUNTER — Other Ambulatory Visit: Payer: Self-pay | Admitting: Nephrology

## 2017-11-14 ENCOUNTER — Ambulatory Visit
Admission: RE | Admit: 2017-11-14 | Discharge: 2017-11-14 | Disposition: A | Discharge status: Home / Self Care | Payer: Medicare Other | Source: Ambulatory Visit | Attending: Nephrology | Admitting: Nephrology

## 2017-11-14 DIAGNOSIS — R0989 Other specified symptoms and signs involving the circulatory and respiratory systems: Secondary | ICD-10-CM | POA: Insufficient documentation

## 2017-11-14 DIAGNOSIS — R0602 Shortness of breath: Secondary | ICD-10-CM

## 2018-02-06 ENCOUNTER — Other Ambulatory Visit: Payer: Self-pay | Admitting: Family Medicine

## 2018-02-06 DIAGNOSIS — Z1231 Encounter for screening mammogram for malignant neoplasm of breast: Secondary | ICD-10-CM

## 2018-02-24 ENCOUNTER — Ambulatory Visit
Admission: RE | Admit: 2018-02-24 | Discharge: 2018-02-24 | Disposition: A | Discharge status: Home / Self Care | Payer: Medicare Other | Source: Ambulatory Visit | Attending: Family Medicine | Admitting: Family Medicine

## 2018-02-24 DIAGNOSIS — Z1231 Encounter for screening mammogram for malignant neoplasm of breast: Secondary | ICD-10-CM | POA: Diagnosis present

## 2018-07-07 DIAGNOSIS — J4540 Moderate persistent asthma, uncomplicated: Secondary | ICD-10-CM | POA: Insufficient documentation

## 2018-09-05 ENCOUNTER — Other Ambulatory Visit
Admission: RE | Admit: 2018-09-05 | Discharge: 2018-09-05 | Disposition: A | Discharge status: Home / Self Care | Payer: Medicare Other | Source: Ambulatory Visit | Attending: Family Medicine | Admitting: Family Medicine

## 2018-09-05 DIAGNOSIS — I10 Essential (primary) hypertension: Secondary | ICD-10-CM | POA: Insufficient documentation

## 2018-09-05 DIAGNOSIS — I509 Heart failure, unspecified: Secondary | ICD-10-CM | POA: Insufficient documentation

## 2018-09-05 LAB — BRAIN NATRIURETIC PEPTIDE: B Natriuretic Peptide: 111 pg/mL — ABNORMAL HIGH (ref 0.0–100.0)

## 2018-09-28 ENCOUNTER — Other Ambulatory Visit (HOSPITAL_COMMUNITY): Payer: Self-pay | Admitting: Neurosurgery

## 2018-09-28 ENCOUNTER — Other Ambulatory Visit: Payer: Self-pay | Admitting: Neurosurgery

## 2018-09-28 DIAGNOSIS — R200 Anesthesia of skin: Secondary | ICD-10-CM

## 2018-10-11 ENCOUNTER — Other Ambulatory Visit: Payer: Self-pay

## 2018-10-11 ENCOUNTER — Ambulatory Visit
Admission: RE | Admit: 2018-10-11 | Discharge: 2018-10-11 | Disposition: A | Discharge status: Home / Self Care | Payer: Medicare Other | Source: Ambulatory Visit | Attending: Neurosurgery | Admitting: Neurosurgery

## 2018-10-11 DIAGNOSIS — R200 Anesthesia of skin: Secondary | ICD-10-CM

## 2019-01-19 ENCOUNTER — Other Ambulatory Visit: Payer: Self-pay | Admitting: Family Medicine

## 2019-01-19 DIAGNOSIS — Z1231 Encounter for screening mammogram for malignant neoplasm of breast: Secondary | ICD-10-CM

## 2019-02-19 DIAGNOSIS — I129 Hypertensive chronic kidney disease with stage 1 through stage 4 chronic kidney disease, or unspecified chronic kidney disease: Secondary | ICD-10-CM | POA: Insufficient documentation

## 2019-02-19 DIAGNOSIS — E875 Hyperkalemia: Secondary | ICD-10-CM | POA: Insufficient documentation

## 2019-02-19 DIAGNOSIS — N2581 Secondary hyperparathyroidism of renal origin: Secondary | ICD-10-CM | POA: Insufficient documentation

## 2019-02-19 DIAGNOSIS — R609 Edema, unspecified: Secondary | ICD-10-CM | POA: Insufficient documentation

## 2019-02-19 DIAGNOSIS — N1831 Chronic kidney disease, stage 3a: Secondary | ICD-10-CM | POA: Insufficient documentation

## 2019-02-26 ENCOUNTER — Ambulatory Visit
Admission: RE | Admit: 2019-02-26 | Discharge: 2019-02-26 | Disposition: A | Discharge status: Home / Self Care | Payer: Medicare Other | Source: Ambulatory Visit | Attending: Family Medicine | Admitting: Family Medicine

## 2019-02-26 DIAGNOSIS — Z1231 Encounter for screening mammogram for malignant neoplasm of breast: Secondary | ICD-10-CM | POA: Insufficient documentation

## 2019-05-08 ENCOUNTER — Encounter: Payer: Self-pay | Admitting: Orthopaedic Surgery

## 2019-05-08 ENCOUNTER — Ambulatory Visit (INDEPENDENT_AMBULATORY_CARE_PROVIDER_SITE_OTHER): Payer: Medicare Other | Admitting: Orthopaedic Surgery

## 2019-05-08 ENCOUNTER — Ambulatory Visit: Payer: Self-pay

## 2019-05-08 ENCOUNTER — Other Ambulatory Visit: Payer: Self-pay

## 2019-05-08 ENCOUNTER — Ambulatory Visit (INDEPENDENT_AMBULATORY_CARE_PROVIDER_SITE_OTHER): Payer: Medicare Other

## 2019-05-08 DIAGNOSIS — M25561 Pain in right knee: Secondary | ICD-10-CM | POA: Diagnosis not present

## 2019-05-08 DIAGNOSIS — M1711 Unilateral primary osteoarthritis, right knee: Secondary | ICD-10-CM | POA: Insufficient documentation

## 2019-05-08 DIAGNOSIS — M25551 Pain in right hip: Secondary | ICD-10-CM | POA: Diagnosis not present

## 2019-05-08 DIAGNOSIS — M7061 Trochanteric bursitis, right hip: Secondary | ICD-10-CM

## 2019-05-08 MED ORDER — LIDOCAINE HCL 1 % IJ SOLN
3.0000 mL | INTRAMUSCULAR | Status: AC | PRN
  Administered 2019-05-08: 3 mL

## 2019-05-08 MED ORDER — METHYLPREDNISOLONE ACETATE 40 MG/ML IJ SUSP
40.0000 mg | INTRAMUSCULAR | Status: AC | PRN
  Administered 2019-05-08: 40 mg via INTRA_ARTICULAR

## 2019-05-08 NOTE — Progress Notes (Signed)
Office Visit Note   Patient: Alisha Mullen           Date of Birth: August 16, 1934           MRN: [id number] Visit Date: 05/08/2019              Requested by: [name], MD [address] [place],  [place] [address] PCP: [name], MD   Assessment & Plan: Visit Diagnoses:  1. Pain in right hip   2. Right knee pain, unspecified chronicity   3. Unilateral primary osteoarthritis, right knee   4. Trochanteric bursitis of right hip     Plan: Right now she would really prefer to stay as conservative as possible with just trying steroid injections in her right knee joint and over the trochanteric area of her right hip.  I agree with this treatment plan.  She understands the risk and benefits of injections.  I did place injections in both areas which she tolerated well.  She can always have repeat injections in 3 months.  My only other recommendation would be considering a right knee replacement surgery.  All question concerns were answered addressed.  Follow-up can be as needed otherwise.  Follow-Up Instructions: Return if symptoms worsen or fail to improve.   Orders:  Orders Placed This Encounter  Procedures  . Large Joint Inj  . Large Joint Inj  . XR HIP UNILAT W OR W/O PELVIS 2-3 VIEWS RIGHT  . XR Knee 1-2 Views Right   No orders of the defined types were placed in this encounter.     Procedures: Large Joint Inj: R knee on 05/08/2019 9:22 AM Indications: diagnostic evaluation and pain Details: 22 G 1.5 in needle, superolateral approach  Arthrogram: No  Medications: 3 mL lidocaine 1 %; 40 mg methylPREDNISolone acetate 40 MG/ML Outcome: tolerated well, no immediate complications Procedure, treatment alternatives, risks and benefits explained, specific risks discussed. Consent was given by the patient. Immediately prior to procedure a time out was called to verify the correct patient, procedure, equipment, support staff  and site/side marked as required. Patient was prepped and draped in the usual sterile fashion.   Large Joint Inj: R greater trochanter on 05/08/2019 9:22 AM Indications: pain and diagnostic evaluation Details: 22 G 1.5 in needle, lateral approach  Arthrogram: No  Medications: 3 mL lidocaine 1 %; 40 mg methylPREDNISolone acetate 40 MG/ML Outcome: tolerated well, no immediate complications Procedure, treatment alternatives, risks and benefits explained, specific risks discussed. Consent was given by the patient. Immediately prior to procedure a time out was called to verify the correct patient, procedure, equipment, support staff and site/side marked as required. Patient was prepped and draped in the usual sterile fashion.       Clinical Data: No additional findings.   Subjective: Chief Complaint  Patient presents with  . Right Hip - Pain  . Right Knee - Pain  The patient is a very pleasant 85 year old female that we have not seen in quite some time.  We actually replaced her left hip in 2017.  She ambulates with a cane and is very hunched over.  She has had previous lumbar spine surgery.  She is someone who is also moderately obese.  She comes in today with right lateral hip pain and severe right knee pain.  She is on Eliquis as a blood thinner.  She is not a diabetic.  Her right knee hurts daily and it is detrimentally  affecting her quality of life, her mobility and her actives of daily living.  It can be 10-10 at times.  She has had no other acute changes in her medical status.  HPI  Review of Systems She currently denies any headache, chest pain, shortness of breath, fever, chills, nausea, vomiting  Objective: Vital Signs: There were no vitals taken for this visit.  Physical Exam She is alert and orient x3 and in no acute distress.  She does ambulate very slowly.  She walks quite hunched over.  She does use a cane. Ortho Exam Examination of her right knee does show significant  varus malalignment.  There is medial joint line tenderness and significant patellofemoral crepitation.  The knee feels loose is stable.  Examination of the right hip shows it moves smoothly with no significant pain in the groin on internal or external rotation.  There is pain on palpation of the right trochanteric area. Specialty Comments:  No specialty comments available.  Imaging: XR HIP UNILAT W OR W/O PELVIS 2-3 VIEWS RIGHT  Result Date: 05/08/2019 An AP pelvis lateral right hip shows moderate arthritic changes in the right hip.  There is a well-seated left total hip arthroplasty.  XR Knee 1-2 Views Right  Result Date: 05/08/2019 2 views of the right knee show profound severe end-stage arthritis.  There is varus malalignment and severe bone-on-bone wear throughout the knee.    PMFS History: Patient Active Problem List   Diagnosis Date Noted  . Unilateral primary osteoarthritis, right knee 05/08/2019  . Status post total replacement of left hip 05/30/2015  . Grade 1 Anterolisthesis of L5 over S1 with fusion 02/11/2015  . Lumbar foraminal stenosis (Moderate Left L1-2; Severe Left L2-3) 02/11/2015  . Gluteal muscle Tendinosis 01/23/2015  . Osteoarthritis of hip (Left) 01/23/2015  . Obesity, Class II, BMI 35-39.9 01/23/2015  . Lumbar spondylosis 01/23/2015  . Osteoarthrosis 01/23/2015  . Lumbar spinal stenosis (Mild L2-3, L3-4, L4-5) 01/23/2015  . Lumbosacral foraminal stenosis (Left L1-2, L2-3) 01/23/2015  . Lumbar facet hypertrophy (Bilateral L3-4 and L4-5) 01/23/2015  . Lumbar postlaminectomy syndrome (L4 laminectomy and L4-5 PLIF) 01/23/2015  . Long term current use of opiate analgesic 01/13/2015  . Long term prescription opiate use 01/13/2015  . Opiate use 01/13/2015  . Opiate dependence ([place]) 01/13/2015  . Encounter for therapeutic drug level monitoring 01/13/2015  . 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 Chronic low back pain 01/13/2015  . Chronic lower  extremity pain (Left) 01/13/2015  . Chronic lumbar radicular pain (Left) 01/13/2015  . Myofascial pain syndrome (buttocks area) (Left) 01/09/2015  . Musculoskeletal pain 01/09/2015  . Chronic pain 01/01/2015  . Chronic hip pain (Left) 01/01/2015  . Other long term (current) drug therapy 01/01/2015  . History of lumbosacral spine surgery 01/01/2015  . Epilepsy ([place]) 06/11/2011  . Hypercholesterolemia 06/11/2011  . BP (high blood pressure) 06/11/2011  . Adiposity 06/11/2011  . Osteoporosis, post-menopausal 06/11/2011  . Leg varices 06/11/2011   Past Medical History:  Diagnosis Date  . Allergic rhinitis   . Arthritis   . Bruises easily   . 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 Chronic kidney disease    decreased rt kidney function - followed by [facility]  . Diverticulosis   . Epilepsy ([place])   . GERD (gastroesophageal reflux disease)   . H/O degenerative disc disease   . Hyperlipidemia   . Hypertension   . Obesity   . Seizures (  [place])    last seizure 10 yrs ago  "controlled epilepsy"  . Senile osteoporosis   . Valvular heart disease   . Varicose veins of lower extremities with inflammation     Family History  Problem Relation Age of Onset  . Cancer Mother   . Hypertension Mother   . Pancreatic cancer Mother   . Heart disease Father   . Colon polyps Sister   . Breast cancer Sister 59  . Heart attack Brother   . Colon cancer Brother     Past Surgical History:  Procedure Laterality Date  . ABDOMINAL HYSTERECTOMY    . APPENDECTOMY    . BACK SURGERY  2011  . BREAST EXCISIONAL BIOPSY Left    surgical bx neg  . BREAST EXCISIONAL BIOPSY Right    surgical neg  . BREAST SURGERY  1970 / 1980   benign lumpectomy  . CARPAL TUNNEL RELEASE    . CATARACT EXTRACTION BILATERAL W/ ANTERIOR VITRECTOMY Bilateral   . CHOLECYSTECTOMY    . COLONOSCOPY    . COLONOSCOPY WITH PROPOFOL N/A 07/08/2017   Procedure: COLONOSCOPY WITH PROPOFOL;  Surgeon:  Manya Silvas, MD;  Location[facility] ENDOSCOPY;  Service: Endoscopy;  Laterality: N/A;  . ESOPHAGOGASTRODUODENOSCOPY (EGD) WITH PROPOFOL N/A 07/08/2017   Procedure: ESOPHAGOGASTRODUODENOSCOPY (EGD) WITH PROPOFOL;  Surgeon: Manya Silvas, MD;  Location[facility] ENDOSCOPY;  Service: Endoscopy;  Laterality: N/A;  . FOOT SURGERY Right 2005   both right and left feet  . JOINT REPLACEMENT     total L shoulder / total L knee  . REPLACEMENT TOTAL KNEE Left 2001  . SHOULDER SURGERY Right   . TONSILLECTOMY    . TOTAL HIP ARTHROPLASTY Left 05/30/2015   Procedure: LEFT TOTAL HIP ARTHROPLASTY ANTERIOR APPROACH;  Surgeon: Mcarthur Rossetti, MD;  Location: [facility] ORS;  Service: Orthopedics;  Laterality: Left;  . TOTAL SHOULDER REPLACEMENT Left 2007   Social History   Occupational History  . Not on file  Tobacco Use  . Smoking status: Never Smoker  . Smokeless tobacco: Never Used  Substance and Sexual Activity  . Alcohol use: No  . Drug use: No  . Sexual activity: Not on file

## 2019-08-02 ENCOUNTER — Telehealth: Payer: Self-pay | Admitting: Orthopaedic Surgery

## 2019-08-02 NOTE — Telephone Encounter (Signed)
Patient called to confirm appointment. Called patient to verify appointment. No phone call needed.

## 2019-08-02 NOTE — Telephone Encounter (Signed)
Patient called.   Said she was returning a call from our office. I could not tell where it was from so I told her I would just make her providers aware that she called.   Call back: [phone]

## 2019-08-02 NOTE — Telephone Encounter (Signed)
Patient need

## 2019-08-07 ENCOUNTER — Encounter: Payer: Self-pay | Admitting: Orthopaedic Surgery

## 2019-08-07 ENCOUNTER — Other Ambulatory Visit: Payer: Self-pay

## 2019-08-07 ENCOUNTER — Ambulatory Visit (INDEPENDENT_AMBULATORY_CARE_PROVIDER_SITE_OTHER): Payer: Medicare Other | Admitting: Orthopaedic Surgery

## 2019-08-07 DIAGNOSIS — M25561 Pain in right knee: Secondary | ICD-10-CM | POA: Diagnosis not present

## 2019-08-07 DIAGNOSIS — M7061 Trochanteric bursitis, right hip: Secondary | ICD-10-CM

## 2019-08-07 DIAGNOSIS — M1711 Unilateral primary osteoarthritis, right knee: Secondary | ICD-10-CM

## 2019-08-07 DIAGNOSIS — M25551 Pain in right hip: Secondary | ICD-10-CM

## 2019-08-07 MED ORDER — LIDOCAINE HCL 1 % IJ SOLN
3.0000 mL | INTRAMUSCULAR | Status: AC | PRN
  Administered 2019-08-07: 3 mL

## 2019-08-07 MED ORDER — METHYLPREDNISOLONE ACETATE 40 MG/ML IJ SUSP
40.0000 mg | INTRAMUSCULAR | Status: AC | PRN
  Administered 2019-08-07: 40 mg via INTRA_ARTICULAR

## 2019-08-07 NOTE — Progress Notes (Signed)
Office Visit Note   Patient: Alisha Mullen           Date of Birth: Oct 17, 1934           MRN: [id number] Visit Date: 08/07/2019              Requested by: [name], MD [address],  [place] [id number] PCP: [name], MD   Assessment & Plan: Visit Diagnoses:  1. Pain in right hip   2. Right knee pain, unspecified chronicity   3. Unilateral primary osteoarthritis, right knee   4. Trochanteric bursitis, right hip     Plan: I did place a steroid injection in her right knee and her right trochanteric area today.  We will try hinged knee brace for her.  All questions and concerns were answered and addressed.  She understands she should wait at least 3 to 4 months between injections.  Follow-up as otherwise as needed.  Follow-Up Instructions: Return if symptoms worsen or fail to improve.   Orders:  Orders Placed This Encounter  Procedures  . Large Joint Inj  . Large Joint Inj   No orders of the defined types were placed in this encounter.     Procedures: Large Joint Inj: R knee on 08/07/2019 11:16 AM Indications: diagnostic evaluation and pain Details: 22 G 1.5 in needle, superolateral approach  Arthrogram: No  Medications: 3 mL lidocaine 1 %; 40 mg methylPREDNISolone acetate 40 MG/ML Outcome: tolerated well, no immediate complications Procedure, treatment alternatives, risks and benefits explained, specific risks discussed. Consent was given by the patient. Immediately prior to procedure a time out was called to verify the correct patient, procedure, equipment, support staff and site/side marked as required. Patient was prepped and draped in the usual sterile fashion.   Large Joint Inj: R greater trochanter on 08/07/2019 11:17 AM Indications: pain and diagnostic evaluation Details: 22 G 1.5 in needle, lateral approach  Arthrogram: No  Medications: 3 mL lidocaine 1 %; 40 mg methylPREDNISolone acetate 40  MG/ML Outcome: tolerated well, no immediate complications Procedure, treatment alternatives, risks and benefits explained, specific risks discussed. Consent was given by the patient. Immediately prior to procedure a time out was called to verify the correct patient, procedure, equipment, support staff and site/side marked as required. Patient was prepped and draped in the usual sterile fashion.       Clinical Data: No additional findings.   Subjective: Chief Complaint  Patient presents with  . Right Hip - Follow-up  . Right Knee - Follow-up  The patient is well-known to Alisha Mullen.  She is 85 year old female with debilitating arthritis of her right knee without the end-stage.  She also has right hip pain of the trochanteric area.  We have injected both these areas before.  She ambulates with a quad cane.  She is adverse to any type of knee replacement at this point.  She states she is dealing with a lung issue and sees her pulmonologist soon.  She is now diabetic.  She has had no other acute change in her medical status.  She is requesting a steroid injection in her right knee today and in the right hip trochanteric area.  It has been at least over 3 months since her last injections.  She is also requesting at least some type of a hinged knee brace for her right knee that can give her some support.  HPI  Review of Systems She does  report some chronic shortness of breath.  She denies any chest pain.  She denies any fever, chills, nausea, vomiting  Objective: Vital Signs: There were no vitals taken for this visit.  Physical Exam She is alert and orient x3 and in no acute distress Ortho Exam Examination of her right hip shows full range of motion of the hip with only pain over the trochanteric area.  Examination of her right knee shows severe medial joint line tenderness as well as patellofemoral crepitation and varus malalignment. Specialty Comments:  No specialty comments  available.  Imaging: No results found.   PMFS History: Patient Active Problem List   Diagnosis Date Noted  . Unilateral primary osteoarthritis, right knee 05/08/2019  . Status post total replacement of left hip 05/30/2015  . Grade 1 Anterolisthesis of L5 over S1 with fusion 02/11/2015  . Lumbar foraminal stenosis (Moderate Left L1-2; Severe Left L2-3) 02/11/2015  . Gluteal muscle Tendinosis 01/23/2015  . Osteoarthritis of hip (Left) 01/23/2015  . Obesity, Class II, BMI 35-39.9 01/23/2015  . Lumbar spondylosis 01/23/2015  . Osteoarthrosis 01/23/2015  . Lumbar spinal stenosis (Mild L2-3, L3-4, L4-5) 01/23/2015  . Lumbosacral foraminal stenosis (Left L1-2, L2-3) 01/23/2015  . Lumbar facet hypertrophy (Bilateral L3-4 and L4-5) 01/23/2015  . Lumbar postlaminectomy syndrome (L4 laminectomy and L4-5 PLIF) 01/23/2015  . Long term current use of opiate analgesic 01/13/2015  . Long term prescription opiate use 01/13/2015  . Opiate use 01/13/2015  . Opiate dependence ([place]) 01/13/2015  . Encounter for therapeutic drug level monitoring 01/13/2015  . 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 Chronic low back pain 01/13/2015  . Chronic lower extremity pain (Left) 01/13/2015  . Chronic lumbar radicular pain (Left) 01/13/2015  . Myofascial pain syndrome (buttocks area) (Left) 01/09/2015  . Musculoskeletal pain 01/09/2015  . Chronic pain 01/01/2015  . Chronic hip pain (Left) 01/01/2015  . Other long term (current) drug therapy 01/01/2015  . History of lumbosacral spine surgery 01/01/2015  . Epilepsy ([place]) 06/11/2011  . Hypercholesterolemia 06/11/2011  . BP (high blood pressure) 06/11/2011  . Adiposity 06/11/2011  . Osteoporosis, post-menopausal 06/11/2011  . Leg varices 06/11/2011   Past Medical History:  Diagnosis Date  . Allergic rhinitis   . Arthritis   . Bruises easily   . 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 Chronic kidney disease    decreased rt kidney  function - followed by [facility]  . Diverticulosis   . Epilepsy ([place])   . GERD (gastroesophageal reflux disease)   . H/O degenerative disc disease   . Hyperlipidemia   . Hypertension   . Obesity   . Seizures ([place])    last seizure 10 yrs ago  "controlled epilepsy"  . Senile osteoporosis   . Valvular heart disease   . Varicose veins of lower extremities with inflammation     Family History  Problem Relation Age of Onset  . Cancer Mother   . Hypertension Mother   . Pancreatic cancer Mother   . Heart disease Father   . Colon polyps Sister   . Breast cancer Sister 78  . Heart attack Brother   . Colon cancer Brother     Past Surgical History:  Procedure Laterality Date  . ABDOMINAL HYSTERECTOMY    . APPENDECTOMY    . BACK SURGERY  2011  . BREAST EXCISIONAL BIOPSY Left    surgical bx neg  . BREAST EXCISIONAL BIOPSY Right    surgical  neg  . BREAST SURGERY  1970 / 1980   benign lumpectomy  . CARPAL TUNNEL RELEASE    . CATARACT EXTRACTION BILATERAL W/ ANTERIOR VITRECTOMY Bilateral   . CHOLECYSTECTOMY    . COLONOSCOPY    . COLONOSCOPY WITH PROPOFOL N/A 07/08/2017   Procedure: COLONOSCOPY WITH PROPOFOL;  Surgeon: Manya Silvas, MD;  Location: [facility] ENDOSCOPY;  Service: Endoscopy;  Laterality: N/A;  . ESOPHAGOGASTRODUODENOSCOPY (EGD) WITH PROPOFOL N/A 07/08/2017   Procedure: ESOPHAGOGASTRODUODENOSCOPY (EGD) WITH PROPOFOL;  Surgeon: Manya Silvas, MD;  Location: [facility] ENDOSCOPY;  Service: Endoscopy;  Laterality: N/A;  . FOOT SURGERY Right 2005   both right and left feet  . JOINT REPLACEMENT     total L shoulder / total L knee  . REPLACEMENT TOTAL KNEE Left 2001  . SHOULDER SURGERY Right   . TONSILLECTOMY    . TOTAL HIP ARTHROPLASTY Left 05/30/2015   Procedure: LEFT TOTAL HIP ARTHROPLASTY ANTERIOR APPROACH;  Surgeon: Mcarthur Rossetti, MD;  Location: [facility] ORS;  Service: Orthopedics;  Laterality: Left;  . TOTAL SHOULDER REPLACEMENT Left 2007    Social History   Occupational History  . Not on file  Tobacco Use  . Smoking status: Never Smoker  . Smokeless tobacco: Never Used  Substance and Sexual Activity  . Alcohol use: No  . Drug use: No  . Sexual activity: Not on file

## 2019-08-20 DIAGNOSIS — N390 Urinary tract infection, site not specified: Secondary | ICD-10-CM | POA: Insufficient documentation

## 2019-11-07 ENCOUNTER — Encounter: Payer: Self-pay | Admitting: Orthopaedic Surgery

## 2019-11-07 ENCOUNTER — Ambulatory Visit (INDEPENDENT_AMBULATORY_CARE_PROVIDER_SITE_OTHER): Payer: Medicare Other | Admitting: Orthopaedic Surgery

## 2019-11-07 ENCOUNTER — Other Ambulatory Visit: Payer: Self-pay

## 2019-11-07 DIAGNOSIS — M1711 Unilateral primary osteoarthritis, right knee: Secondary | ICD-10-CM | POA: Diagnosis not present

## 2019-11-07 DIAGNOSIS — M7061 Trochanteric bursitis, right hip: Secondary | ICD-10-CM

## 2019-11-07 MED ORDER — METHYLPREDNISOLONE ACETATE 40 MG/ML IJ SUSP
40.0000 mg | INTRAMUSCULAR | Status: AC | PRN
  Administered 2019-11-07: 40 mg via INTRA_ARTICULAR

## 2019-11-07 MED ORDER — LIDOCAINE HCL 1 % IJ SOLN
3.0000 mL | INTRAMUSCULAR | Status: AC | PRN
  Administered 2019-11-07: 3 mL

## 2019-11-07 NOTE — Progress Notes (Addendum)
   Procedure Note  Patient: Alisha Mullen             Date of Birth: Sep 23, 1934           MRN: [id number]             Visit Date: 11/07/2019 HPI: Alisha Mullen is well-known to our department service comes in today requesting right hip injection for trochanteric bursitis and right knee injection due to end-stage arthritis of the right knee.  She last had injections on 08/07/2019 both the hip and knee and states he did well until 2 weeks ago.  She had no new injuries.  She denies any fevers chills shortness of breath.  She states that the injections had no adverse effects.  Physical exam: Right knee: No abnormal warmth erythema or effusion.  Varus malalignment.  She has full extension flexion to around 90 degrees.  Right hip tenderness over the right trochanteric region.  No rashes skin lesions ulcerations.  Procedures: Visit Diagnoses:  1. Unilateral primary osteoarthritis, right knee   2. Trochanteric bursitis, right hip     Large Joint Inj: R knee on 11/07/2019 10:52 AM Indications: pain Details: 22 G 1.5 in needle, anterolateral approach  Arthrogram: No  Medications: 3 mL lidocaine 1 %; 40 mg methylPREDNISolone acetate 40 MG/ML Outcome: tolerated well, no immediate complications Procedure, treatment alternatives, risks and benefits explained, specific risks discussed. Consent was given by the patient. Immediately prior to procedure a time out was called to verify the correct patient, procedure, equipment, support staff and site/side marked as required. Patient was prepped and draped in the usual sterile fashion.   Large Joint Inj: R greater trochanter on 11/07/2019 10:52 AM Indications: pain Details: 22 G 1.5 in needle, lateral approach  Arthrogram: No  Medications: 3 mL lidocaine 1 %; 40 mg methylPREDNISolone acetate 40 MG/ML Outcome: tolerated well, no immediate complications Procedure, treatment alternatives, risks and benefits explained, specific risks discussed. Consent was given  by the patient. Immediately prior to procedure a time out was called to verify the correct patient, procedure, equipment, support staff and site/side marked as required. Patient was prepped and draped in the usual sterile fashion.     Per her request she would like cortisone injections in the knee and the hip in 3 months.  I did remind her we would like to space these out as long as possible.  However at minium would wait at least 3 months between injections.  Questions encouraged and answered

## 2020-02-06 ENCOUNTER — Ambulatory Visit (INDEPENDENT_AMBULATORY_CARE_PROVIDER_SITE_OTHER): Payer: Medicare Other | Admitting: Orthopaedic Surgery

## 2020-02-06 ENCOUNTER — Encounter: Payer: Self-pay | Admitting: Orthopaedic Surgery

## 2020-02-06 DIAGNOSIS — M1711 Unilateral primary osteoarthritis, right knee: Secondary | ICD-10-CM

## 2020-02-06 DIAGNOSIS — M7061 Trochanteric bursitis, right hip: Secondary | ICD-10-CM

## 2020-02-06 MED ORDER — METHYLPREDNISOLONE ACETATE 40 MG/ML IJ SUSP
40.0000 mg | INTRAMUSCULAR | Status: AC | PRN
  Administered 2020-02-06: 40 mg via INTRA_ARTICULAR

## 2020-02-06 MED ORDER — LIDOCAINE HCL 1 % IJ SOLN
3.0000 mL | INTRAMUSCULAR | Status: AC | PRN
  Administered 2020-02-06: 3 mL

## 2020-02-06 MED ORDER — LIDOCAINE HCL 1 % IJ SOLN
5.0000 mL | INTRAMUSCULAR | Status: AC | PRN
  Administered 2020-02-06: 5 mL

## 2020-02-06 NOTE — Progress Notes (Signed)
° °  Procedure Note  Patient: Alisha Mullen             Date of Birth: 1934-12-26           MRN: [id number]             Visit Date: 02/06/2020 HPI: Alisha Mullen comes in today requesting injections again in her right knee and hip.  She states the injections helped.  The hip injection helped more so than the right knee injection.  She has had no known injuries.  She said no fevers chills or recent vaccines.  Physical exam: Right knee good range of motion.  She has tenderness along medial joint line.  No abnormal warmth or erythema.  Slight effusion. Right hip good range of motion tenderness over the right trochanteric region. Procedures: Visit Diagnoses:  1. Unilateral primary osteoarthritis, right knee   2. Trochanteric bursitis, right hip     Large Joint Inj on 02/06/2020 11:01 AM Indications: pain Details: 22 G 1.5 in needle, anterolateral approach  Arthrogram: No  Medications: 40 mg methylPREDNISolone acetate 40 MG/ML; 5 mL lidocaine 1 % Aspirate: 20 mL serous and blood-tinged Outcome: tolerated well, no immediate complications Procedure, treatment alternatives, risks and benefits explained, specific risks discussed. Consent was given by the patient. Immediately prior to procedure a time out was called to verify the correct patient, procedure, equipment, support staff and site/side marked as required. Patient was prepped and draped in the usual sterile fashion.   Large Joint Inj: R greater trochanter on 02/06/2020 11:02 AM Indications: pain Details: 22 G 1.5 in needle, lateral approach  Arthrogram: No  Medications: 3 mL lidocaine 1 %; 40 mg methylPREDNISolone acetate 40 MG/ML Outcome: tolerated well, no immediate complications Procedure, treatment alternatives, risks and benefits explained, specific risks discussed. Consent was given by the patient. Immediately prior to procedure a time out was called to verify the correct patient, procedure, equipment, support staff and site/side  marked as required. Patient was prepped and draped in the usual sterile fashion.     Plan: She knows to wait at least 3 months between the injections.  She will follow-up with [place] as needed.  Questions encouraged and answered.

## 2020-05-06 ENCOUNTER — Encounter: Payer: Self-pay | Admitting: Orthopaedic Surgery

## 2020-05-06 ENCOUNTER — Ambulatory Visit (INDEPENDENT_AMBULATORY_CARE_PROVIDER_SITE_OTHER): Payer: Medicare Other | Admitting: Orthopaedic Surgery

## 2020-05-06 DIAGNOSIS — M1711 Unilateral primary osteoarthritis, right knee: Secondary | ICD-10-CM

## 2020-05-06 DIAGNOSIS — M7061 Trochanteric bursitis, right hip: Secondary | ICD-10-CM | POA: Diagnosis not present

## 2020-05-06 MED ORDER — METHYLPREDNISOLONE ACETATE 40 MG/ML IJ SUSP
40.0000 mg | INTRAMUSCULAR | Status: AC | PRN
  Administered 2020-05-06: 40 mg via INTRA_ARTICULAR

## 2020-05-06 MED ORDER — LIDOCAINE HCL 1 % IJ SOLN
3.0000 mL | INTRAMUSCULAR | Status: AC | PRN
  Administered 2020-05-06: 3 mL

## 2020-05-06 NOTE — Progress Notes (Signed)
   Procedure Note  Patient: Alisha Mullen             Date of Birth: 1935/02/22           MRN: [id number]             Visit Date: 05/06/2020  HPI: Mrs. Thurmond Butts comes in today due to right knee and hip pain.  She is requesting injections in both.  She states the injections back in December helped for about 2-1/2 months.  She said no known injury.  Denies any fevers chills.  Denies any change in overall health status.  Physical exam: Right knee good range of motion.  No abnormal warmth erythema.  No effusion. Right hip good range of motion without pain.  Tenderness over the right trochanteric region.  Procedures: Visit Diagnoses:  1. Unilateral primary osteoarthritis, right knee   2. Trochanteric bursitis, right hip     Large Joint Inj: R knee on 05/06/2020 10:32 AM Indications: pain Details: 22 G 1.5 in needle, anterolateral approach  Arthrogram: No  Medications: 3 mL lidocaine 1 %; 40 mg methylPREDNISolone acetate 40 MG/ML Outcome: tolerated well, no immediate complications Procedure, treatment alternatives, risks and benefits explained, specific risks discussed. Consent was given by the patient. Immediately prior to procedure a time out was called to verify the correct patient, procedure, equipment, support staff and site/side marked as required. Patient was prepped and draped in the usual sterile fashion.   Large Joint Inj: R greater trochanter on 05/06/2020 10:32 AM Indications: pain Details: 22 G 1.5 in needle, lateral approach  Arthrogram: No  Medications: 3 mL lidocaine 1 %; 40 mg methylPREDNISolone acetate 40 MG/ML Outcome: tolerated well, no immediate complications Procedure, treatment alternatives, risks and benefits explained, specific risks discussed. Consent was given by the patient. Immediately prior to procedure a time out was called to verify the correct patient, procedure, equipment, support staff and site/side marked as required. Patient was prepped and draped in the  usual sterile fashion.     Plan: She will follow up with [place] as needed.  She understands to wait at least 3 months between injections.  Questions encouraged and answered at length.

## 2020-08-07 DIAGNOSIS — M4125 Other idiopathic scoliosis, thoracolumbar region: Secondary | ICD-10-CM | POA: Insufficient documentation

## 2021-06-15 ENCOUNTER — Ambulatory Visit: Payer: Medicare Other

## 2021-06-15 ENCOUNTER — Ambulatory Visit (INDEPENDENT_AMBULATORY_CARE_PROVIDER_SITE_OTHER): Payer: Medicare Other | Admitting: Podiatry

## 2021-06-15 ENCOUNTER — Encounter: Payer: Self-pay | Admitting: Podiatry

## 2021-06-15 DIAGNOSIS — Q666 Other congenital valgus deformities of feet: Secondary | ICD-10-CM

## 2021-06-15 DIAGNOSIS — M461 Sacroiliitis, not elsewhere classified: Secondary | ICD-10-CM | POA: Insufficient documentation

## 2021-06-15 NOTE — Progress Notes (Signed)
?Subjective:  ?Patient ID: Alisha Mullen, female    DOB: 07-Sep-1934,  MRN: [id number] ?HPI ?Chief Complaint  ?Patient presents with  ? Foot Orthotics  ?  Back Trouble - Patient interested in something that could maybe lift her heels some, noticed she wears her crocs around the house and her back feels better  ? ? ?87 y.o. female presents with the above complaint.  ? ?ROS: Denies fever chills nausea vomiting muscle aches pains calf pain back pain chest pain shortness of breath. ? ?Past Medical History:  ?Diagnosis Date  ? Allergic rhinitis   ? Arthritis   ? Bruises easily   ? Cancer [facility])   ? 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  ? Chronic kidney disease   ? decreased rt kidney function - followed by [facility]  ? Diverticulosis   ? Epilepsy ([place])   ? GERD (gastroesophageal reflux disease)   ? H/O degenerative disc disease   ? Hyperlipidemia   ? Hypertension   ? Obesity   ? Seizures ([place])   ? last seizure 10 yrs ago  "controlled epilepsy"  ? Senile osteoporosis   ? Valvular heart disease   ? Varicose veins of lower extremities with inflammation   ? ?Past Surgical History:  ?Procedure Laterality Date  ? ABDOMINAL HYSTERECTOMY    ? APPENDECTOMY    ? BACK SURGERY  2011  ? BREAST EXCISIONAL BIOPSY Left   ? surgical bx neg  ? BREAST EXCISIONAL BIOPSY Right   ? surgical neg  ? BREAST SURGERY  1970 / 1980  ? benign lumpectomy  ? CARPAL TUNNEL RELEASE    ? CATARACT EXTRACTION BILATERAL W/ ANTERIOR VITRECTOMY Bilateral   ? CHOLECYSTECTOMY    ? COLONOSCOPY    ? COLONOSCOPY WITH PROPOFOL N/A 07/08/2017  ? Procedure: COLONOSCOPY WITH PROPOFOL;  Surgeon: Manya Silvas, MD;  Location[facility] ENDOSCOPY;  Service: Endoscopy;  Laterality: N/A;  ? ESOPHAGOGASTRODUODENOSCOPY (EGD) WITH PROPOFOL N/A 07/08/2017  ? Procedure: ESOPHAGOGASTRODUODENOSCOPY (EGD) WITH PROPOFOL;  Surgeon: Manya Silvas, MD;  Location: [facility] ENDOSCOPY;  Service: Endoscopy;  Laterality: N/A;  ? FOOT SURGERY Right 2005  ? both right and left feet   ? JOINT REPLACEMENT    ? total L shoulder / total L knee  ? REPLACEMENT TOTAL KNEE Left 2001  ? SHOULDER SURGERY Right   ? TONSILLECTOMY    ? TOTAL HIP ARTHROPLASTY Left 05/30/2015  ? Procedure: LEFT TOTAL HIP ARTHROPLASTY ANTERIOR APPROACH;  Surgeon: Mcarthur Rossetti, MD;  Location: [facility] ORS;  Service: Orthopedics;  Laterality: Left;  ? TOTAL SHOULDER REPLACEMENT Left 2007  ? ? ?Current Outpatient Medications:  ?  albuterol (VENTOLIN HFA) 108 (90 Base) MCG/ACT inhaler, Inhale into the lungs., Disp: , Rfl:  ?  apixaban (ELIQUIS) 2.5 MG TABS tablet, Take 2.5 mg by mouth 2 (two) times daily., Disp: , Rfl:  ?  Calcium Carbonate-Vit D-Min (CALCIUM 1200 PO), Take 1,200 mg elemental calcium/kg/hr by mouth at bedtime., Disp: , Rfl:  ?  carbamazepine (TEGRETOL) 200 MG tablet, Take 1 tablet (200 mg total) by mouth 2 (two) times daily. (Patient taking differently: Take 200 mg by mouth 2 (two) times daily. For seizures), Disp: 60 tablet, Rfl: 0 ?  cetirizine (ZYRTEC) 10 MG tablet, Take 10 mg by mouth daily., Disp: , Rfl:  ?  clindamycin (CLEOCIN) 150 MG capsule, Take 150 mg by mouth 3 (three) times daily. Prior to dental procedures, Disp: , Rfl:  ?  CRANBERRY EXTRACT PO, , Disp: , Rfl:  ?  diltiazem (DILACOR XR) 240 MG 24 hr capsule, Take by mouth., Disp: , Rfl:  ?  FLOVENT HFA 220 MCG/ACT inhaler, Inhale into the lungs., Disp: , Rfl:  ?  fluticasone (FLONASE) 50 MCG/ACT nasal spray, Place 2 sprays into both nostrils daily., Disp: , Rfl:  ?  gabapentin (NEURONTIN) 300 MG capsule, Take 1 capsule (300 mg total) by mouth 2 (two) times daily. (Patient taking differently: Take 300 mg by mouth 2 (two) times daily. For seizures), Disp: 90 capsule, Rfl: 11 ?  ipratropium-albuterol (DUONEB) 0.5-2.5 (3) MG/3ML SOLN, Inhale into the lungs., Disp: , Rfl:  ?  meloxicam (MOBIC) 15 MG tablet, Take 15 mg by mouth daily., Disp: , Rfl:  ?  metoprolol succinate (TOPROL-XL) 50 MG 24 hr tablet, Take by mouth., Disp: , Rfl:  ?  montelukast  (SINGULAIR) 10 MG tablet, Take by mouth., Disp: , Rfl:  ?  potassium chloride (KLOR-CON) 20 MEQ packet, Take 20 mEq by mouth daily as needed (for swelling). , Disp: , Rfl:  ?  potassium chloride SA (KLOR-CON M) 20 MEQ tablet, Take by mouth., Disp: , Rfl:  ?  simvastatin (ZOCOR) 20 MG tablet, Take 20 mg by mouth daily. Before bedtime, Disp: , Rfl:  ?  torsemide (DEMADEX) 20 MG tablet, Take 20 mg by mouth daily as needed (for swelling). , Disp: , Rfl:  ?  traMADol (ULTRAM) 50 MG tablet, Take by mouth., Disp: , Rfl:  ?  vitamin B-12 (CYANOCOBALAMIN) 1000 MCG tablet, Take 1,000 mcg by mouth at bedtime., Disp: , Rfl:  ?  vitamin C (ASCORBIC ACID) 500 MG tablet, Take 500 mg by mouth at bedtime. , Disp: , Rfl:  ? ?Current Facility-Administered Medications:  ?  iohexol (OMNIPAQUE) 180 MG/ML injection 10 mL, 10 mL, Intra-articular, Once PRN, Milinda Pointer, MD ? ?Allergies  ?Allergen Reactions  ? Demerol [Meperidine] Other (See Comments)  ?  seizures  ? Metoprolol Other (See Comments)  ?  Shortness of breath and swelling  ? Codeine Other (See Comments)  ?  Chest pain  ? Lotrel [Amlodipine Besy-Benazepril Hcl] Other (See Comments)  ?  Swelling and numbness of mouth  ? Norvasc [Amlodipine Besylate] Swelling  ? Penicillins Swelling  ?  Marland KitchenMarland KitchenHas patient had a PCN reaction causing immediate rash, facial/tongue/throat swelling, SOB or lightheadedness with hypotension: Yes ?_ SWELLING AROUND MOUTH ?If all of the above answers are "NO", then may proceed with Cephalosporin use. ?  ? Sulfa Antibiotics Rash  ? Amlodipine   ? Cephalexin Other (See Comments)  ?  Joint aching  ? Hydrochlorothiazide   ? Ivp Dye [Iodinated Contrast Media]   ?  PT. Stated" can't have dye for MRI, Rt kidney not working 100%"  ? Losartan Potassium   ? Vioxx [Rofecoxib] Other (See Comments)  ?  Muscle aches  ? Hyzaar [Losartan Potassium-Hctz] Other (See Comments)  ?  Muscle aches  ? Lipitor [Atorvastatin] Other (See Comments)  ?  Muscle aches  ? Loratadine  Other (See Comments)  ?  depression  ? ?Review of Systems ?Objective:  ?There were no vitals filed for this visit. ? ?General: Well developed, nourished, in no acute distress, alert and oriented x3  ? ?Dermatological: Skin is warm, dry and supple bilateral. Nails x 10 are well maintained; remaining integument appears unremarkable at this time. There are no open sores, no preulcerative lesions, no rash or signs of infection present. ? ?Vascular: Dorsalis Pedis artery and Posterior Tibial artery pedal pulses are 2/4 bilateral with immedate capillary fill  time. Pedal hair growth present. No varicosities and no lower extremity edema present bilateral.  ? ?Neruologic: Grossly intact via light touch bilateral. Vibratory intact via tuning fork bilateral. Protective threshold with Semmes Wienstein monofilament intact to all pedal sites bilateral. Patellar and Achilles deep tendon reflexes 2+ bilateral. No Babinski or clonus noted bilateral.  ? ?Musculoskeletal: No gross boney pedal deformities bilateral. No pain, crepitus, or limitation noted with foot and ankle range of motion bilateral. Muscular strength 5/5 in all groups tested bilateral.  Mild heel pain on palpation medial calcaneal tubercles. ? ?Gait: Unassisted, Nonantalgic.  ? ? ?Radiographs: ? ?None taken ? ?Assessment & Plan:  ? ?Assessment: Scoliosis and a history of back problems that feel better with arch support and heel support. ? ?Plan: She saw Aaron Edelman today for custom orthotics Plastizote and cork ? ? ? ? ?Alisha Mullen T. [place], DPM ?

## 2021-06-15 NOTE — Progress Notes (Signed)
SITUATION ?Reason for Consult: Evaluation for Bilateral Custom Foot Orthoses ?Patient / Caregiver Report: Patient is ready for foot orthotics ? ?OBJECTIVE DATA: ?Patient History / Diagnosis:  ?  ICD-10-CM   ?1. Bilateral sacroiliitis (HCC)  M46.1   ?  ? ? ?Current or Previous Devices:   Historical user ? ?Foot Examination: ?Skin presentation:   Intact ?Ulcers & Callousing:   None ?Toe / Foot Deformities:  None ?Weight Bearing Presentation:  Planus ?Sensation:    Intact ? ?Shoe Size:    9.5W ? ?ORTHOTIC RECOMMENDATION ?Recommended Device: 1x pair of custom functional foot orthotics ? ?GOALS OF ORTHOSES ?- Reduce Pain ?- Prevent Foot Deformity ?- Prevent Progression of Further Foot Deformity ?- Relieve Pressure ?- Improve the Overall Biomechanical Function of the Foot and Lower Extremity. ? ?ACTIONS PERFORMED ?Potential out of pocket cost was communicated to patient. Patient understood and consent to casting. Patient was casted for Foot Orthoses via crush box. Procedure was explained and patient tolerated procedure well. Casts were shipped to central fabrication. All questions were answered and concerns addressed. ? ?PLAN ?Patient is to be called for fitting when devices are ready.  ? ? ?

## 2021-07-08 ENCOUNTER — Telehealth: Payer: Self-pay

## 2021-07-08 NOTE — Telephone Encounter (Signed)
Left message on machine for patient to pick up orthotics . (Schedule in [place])

## 2021-07-27 ENCOUNTER — Ambulatory Visit (INDEPENDENT_AMBULATORY_CARE_PROVIDER_SITE_OTHER): Payer: Medicare Other

## 2021-07-27 DIAGNOSIS — Q666 Other congenital valgus deformities of feet: Secondary | ICD-10-CM

## 2021-07-27 NOTE — Progress Notes (Signed)
SITUATION: Reason for Visit: Fitting and Delivery of Custom Fabricated Foot Orthoses Patient Report: Patient reports comfort and is satisfied with device.  OBJECTIVE DATA: Patient History / Diagnosis:     ICD-10-CM   1. Pes planovalgus  Q66.6       Provided Device:  Custom Functional Foot Orthotics     [employer]: [id number]  GOAL OF ORTHOSIS - Improve gait - Decrease energy expenditure - Improve Balance - Provide Triplanar stability of foot complex - Facilitate motion  ACTIONS PERFORMED Patient was fit with foot orthotics trimmed to shoe last. Patient tolerated fittign procedure.   Patient was provided with verbal and written instruction and demonstration regarding donning, doffing, wear, care, proper fit, function, purpose, cleaning, and use of the orthosis and in all related precautions and risks and benefits regarding the orthosis.  Patient was also provided with verbal instruction regarding how to report any failures or malfunctions of the orthosis and necessary follow up care. Patient was also instructed to contact our office regarding any change in status that may affect the function of the orthosis.  Patient demonstrated independence with proper donning, doffing, and fit and verbalized understanding of all instructions.  PLAN: Patient is to follow up in one week or as necessary (PRN). All questions were answered and concerns addressed. Plan of care was discussed with and agreed upon by the patient.

## 2022-10-08 DIAGNOSIS — M4125 Other idiopathic scoliosis, thoracolumbar region: Secondary | ICD-10-CM | POA: Diagnosis not present

## 2022-10-12 DIAGNOSIS — N2581 Secondary hyperparathyroidism of renal origin: Secondary | ICD-10-CM | POA: Diagnosis not present

## 2022-10-12 DIAGNOSIS — I10 Essential (primary) hypertension: Secondary | ICD-10-CM | POA: Diagnosis not present

## 2022-10-12 DIAGNOSIS — I129 Hypertensive chronic kidney disease with stage 1 through stage 4 chronic kidney disease, or unspecified chronic kidney disease: Secondary | ICD-10-CM | POA: Diagnosis not present

## 2022-10-19 DIAGNOSIS — N2581 Secondary hyperparathyroidism of renal origin: Secondary | ICD-10-CM | POA: Diagnosis not present

## 2022-10-19 DIAGNOSIS — E875 Hyperkalemia: Secondary | ICD-10-CM | POA: Diagnosis not present

## 2022-10-19 DIAGNOSIS — N1831 Chronic kidney disease, stage 3a: Secondary | ICD-10-CM | POA: Diagnosis not present

## 2022-10-19 DIAGNOSIS — I129 Hypertensive chronic kidney disease with stage 1 through stage 4 chronic kidney disease, or unspecified chronic kidney disease: Secondary | ICD-10-CM | POA: Diagnosis not present

## 2022-10-19 DIAGNOSIS — R609 Edema, unspecified: Secondary | ICD-10-CM | POA: Diagnosis not present

## 2023-02-11 DIAGNOSIS — N1831 Chronic kidney disease, stage 3a: Secondary | ICD-10-CM | POA: Diagnosis not present

## 2023-02-11 DIAGNOSIS — E7800 Pure hypercholesterolemia, unspecified: Secondary | ICD-10-CM | POA: Diagnosis not present

## 2023-02-18 DIAGNOSIS — Z6841 Body Mass Index (BMI) 40.0 and over, adult: Secondary | ICD-10-CM | POA: Diagnosis not present

## 2023-02-18 DIAGNOSIS — I509 Heart failure, unspecified: Secondary | ICD-10-CM | POA: Diagnosis not present

## 2023-02-18 DIAGNOSIS — Z0000 Encounter for general adult medical examination without abnormal findings: Secondary | ICD-10-CM | POA: Diagnosis not present

## 2023-02-18 DIAGNOSIS — Z23 Encounter for immunization: Secondary | ICD-10-CM | POA: Diagnosis not present

## 2023-04-19 DIAGNOSIS — I129 Hypertensive chronic kidney disease with stage 1 through stage 4 chronic kidney disease, or unspecified chronic kidney disease: Secondary | ICD-10-CM | POA: Diagnosis not present

## 2023-04-19 DIAGNOSIS — N1831 Chronic kidney disease, stage 3a: Secondary | ICD-10-CM | POA: Diagnosis not present

## 2023-04-19 DIAGNOSIS — E875 Hyperkalemia: Secondary | ICD-10-CM | POA: Diagnosis not present

## 2023-04-19 DIAGNOSIS — R8290 Unspecified abnormal findings in urine: Secondary | ICD-10-CM | POA: Diagnosis not present

## 2023-04-19 DIAGNOSIS — N2581 Secondary hyperparathyroidism of renal origin: Secondary | ICD-10-CM | POA: Diagnosis not present

## 2023-04-19 DIAGNOSIS — R609 Edema, unspecified: Secondary | ICD-10-CM | POA: Diagnosis not present

## 2023-04-26 DIAGNOSIS — E876 Hypokalemia: Secondary | ICD-10-CM | POA: Diagnosis not present

## 2023-04-26 DIAGNOSIS — N1831 Chronic kidney disease, stage 3a: Secondary | ICD-10-CM | POA: Diagnosis not present

## 2023-04-26 DIAGNOSIS — I129 Hypertensive chronic kidney disease with stage 1 through stage 4 chronic kidney disease, or unspecified chronic kidney disease: Secondary | ICD-10-CM | POA: Diagnosis not present

## 2023-04-26 DIAGNOSIS — N2581 Secondary hyperparathyroidism of renal origin: Secondary | ICD-10-CM | POA: Diagnosis not present

## 2023-04-26 DIAGNOSIS — R609 Edema, unspecified: Secondary | ICD-10-CM | POA: Diagnosis not present

## 2023-05-06 DIAGNOSIS — Z85828 Personal history of other malignant neoplasm of skin: Secondary | ICD-10-CM | POA: Diagnosis not present

## 2023-05-06 DIAGNOSIS — L570 Actinic keratosis: Secondary | ICD-10-CM | POA: Diagnosis not present

## 2023-05-06 DIAGNOSIS — L820 Inflamed seborrheic keratosis: Secondary | ICD-10-CM | POA: Diagnosis not present

## 2023-05-06 DIAGNOSIS — D2272 Melanocytic nevi of left lower limb, including hip: Secondary | ICD-10-CM | POA: Diagnosis not present

## 2023-05-06 DIAGNOSIS — D2262 Melanocytic nevi of left upper limb, including shoulder: Secondary | ICD-10-CM | POA: Diagnosis not present

## 2023-05-06 DIAGNOSIS — L2989 Other pruritus: Secondary | ICD-10-CM | POA: Diagnosis not present

## 2023-05-06 DIAGNOSIS — D225 Melanocytic nevi of trunk: Secondary | ICD-10-CM | POA: Diagnosis not present

## 2023-05-06 DIAGNOSIS — D2261 Melanocytic nevi of right upper limb, including shoulder: Secondary | ICD-10-CM | POA: Diagnosis not present

## 2023-06-22 DIAGNOSIS — Z961 Presence of intraocular lens: Secondary | ICD-10-CM | POA: Diagnosis not present

## 2023-06-22 DIAGNOSIS — G932 Benign intracranial hypertension: Secondary | ICD-10-CM | POA: Diagnosis not present

## 2023-08-12 DIAGNOSIS — N1831 Chronic kidney disease, stage 3a: Secondary | ICD-10-CM | POA: Diagnosis not present

## 2023-08-12 DIAGNOSIS — E7800 Pure hypercholesterolemia, unspecified: Secondary | ICD-10-CM | POA: Diagnosis not present

## 2023-08-18 DIAGNOSIS — I4891 Unspecified atrial fibrillation: Secondary | ICD-10-CM | POA: Diagnosis not present

## 2023-08-18 DIAGNOSIS — G40909 Epilepsy, unspecified, not intractable, without status epilepticus: Secondary | ICD-10-CM | POA: Diagnosis not present

## 2023-08-18 DIAGNOSIS — I509 Heart failure, unspecified: Secondary | ICD-10-CM | POA: Diagnosis not present

## 2023-08-18 DIAGNOSIS — J45909 Unspecified asthma, uncomplicated: Secondary | ICD-10-CM | POA: Diagnosis not present

## 2023-08-18 DIAGNOSIS — E785 Hyperlipidemia, unspecified: Secondary | ICD-10-CM | POA: Diagnosis not present

## 2023-08-18 DIAGNOSIS — Z79899 Other long term (current) drug therapy: Secondary | ICD-10-CM | POA: Diagnosis not present

## 2023-08-18 DIAGNOSIS — N189 Chronic kidney disease, unspecified: Secondary | ICD-10-CM | POA: Diagnosis not present

## 2023-10-04 DIAGNOSIS — I509 Heart failure, unspecified: Secondary | ICD-10-CM | POA: Diagnosis not present

## 2023-12-07 DIAGNOSIS — R609 Edema, unspecified: Secondary | ICD-10-CM | POA: Diagnosis not present

## 2023-12-07 DIAGNOSIS — I10 Essential (primary) hypertension: Secondary | ICD-10-CM | POA: Diagnosis not present

## 2023-12-07 DIAGNOSIS — E875 Hyperkalemia: Secondary | ICD-10-CM | POA: Diagnosis not present

## 2023-12-07 DIAGNOSIS — E876 Hypokalemia: Secondary | ICD-10-CM | POA: Diagnosis not present

## 2023-12-07 DIAGNOSIS — I129 Hypertensive chronic kidney disease with stage 1 through stage 4 chronic kidney disease, or unspecified chronic kidney disease: Secondary | ICD-10-CM | POA: Diagnosis not present

## 2023-12-07 DIAGNOSIS — N1831 Chronic kidney disease, stage 3a: Secondary | ICD-10-CM | POA: Diagnosis not present

## 2023-12-07 DIAGNOSIS — N2581 Secondary hyperparathyroidism of renal origin: Secondary | ICD-10-CM | POA: Diagnosis not present

## 2023-12-13 DIAGNOSIS — N2581 Secondary hyperparathyroidism of renal origin: Secondary | ICD-10-CM | POA: Diagnosis not present

## 2023-12-13 DIAGNOSIS — N390 Urinary tract infection, site not specified: Secondary | ICD-10-CM | POA: Diagnosis not present

## 2023-12-13 DIAGNOSIS — N1831 Chronic kidney disease, stage 3a: Secondary | ICD-10-CM | POA: Diagnosis not present

## 2023-12-13 DIAGNOSIS — N261 Atrophy of kidney (terminal): Secondary | ICD-10-CM | POA: Diagnosis not present

## 2023-12-13 DIAGNOSIS — I129 Hypertensive chronic kidney disease with stage 1 through stage 4 chronic kidney disease, or unspecified chronic kidney disease: Secondary | ICD-10-CM | POA: Diagnosis not present

## 2023-12-13 DIAGNOSIS — E876 Hypokalemia: Secondary | ICD-10-CM | POA: Diagnosis not present

## 2023-12-13 DIAGNOSIS — I10 Essential (primary) hypertension: Secondary | ICD-10-CM | POA: Diagnosis not present

## 2023-12-13 DIAGNOSIS — R609 Edema, unspecified: Secondary | ICD-10-CM | POA: Diagnosis not present

## 2023-12-13 DIAGNOSIS — E875 Hyperkalemia: Secondary | ICD-10-CM | POA: Diagnosis not present

## 2024-01-10 DIAGNOSIS — Z23 Encounter for immunization: Secondary | ICD-10-CM | POA: Diagnosis not present

## 2024-01-10 DIAGNOSIS — M25552 Pain in left hip: Secondary | ICD-10-CM | POA: Diagnosis not present

## 2024-03-28 ENCOUNTER — Ambulatory Visit
